# Patient Record
Sex: Male | Born: 1950 | Race: Black or African American | Hispanic: No | Marital: Married | Smoking: Never smoker
Health system: Southern US, Community
[De-identification: ages and names within clinical notes are randomized; demographics above are authoritative.]

## PROBLEM LIST (undated history)

## (undated) DIAGNOSIS — R972 Elevated prostate specific antigen [PSA]: Secondary | ICD-10-CM

## (undated) DIAGNOSIS — E119 Type 2 diabetes mellitus without complications: Secondary | ICD-10-CM

## (undated) DIAGNOSIS — N400 Enlarged prostate without lower urinary tract symptoms: Secondary | ICD-10-CM

## (undated) DIAGNOSIS — I10 Essential (primary) hypertension: Secondary | ICD-10-CM

## (undated) HISTORY — PX: NO PAST SURGERIES: SHX2092

---

## 2004-11-08 ENCOUNTER — Inpatient Hospital Stay: Payer: Self-pay | Admitting: Internal Medicine

## 2005-04-22 ENCOUNTER — Emergency Department: Payer: Self-pay | Admitting: Emergency Medicine

## 2006-05-18 IMAGING — CR DG CHEST 1V PORT
1 series · 1 of 1 positions shown · non-contrast
Comparison: none

REASON FOR EXAM: Weakness
COMMENTS:

[view not recorded]
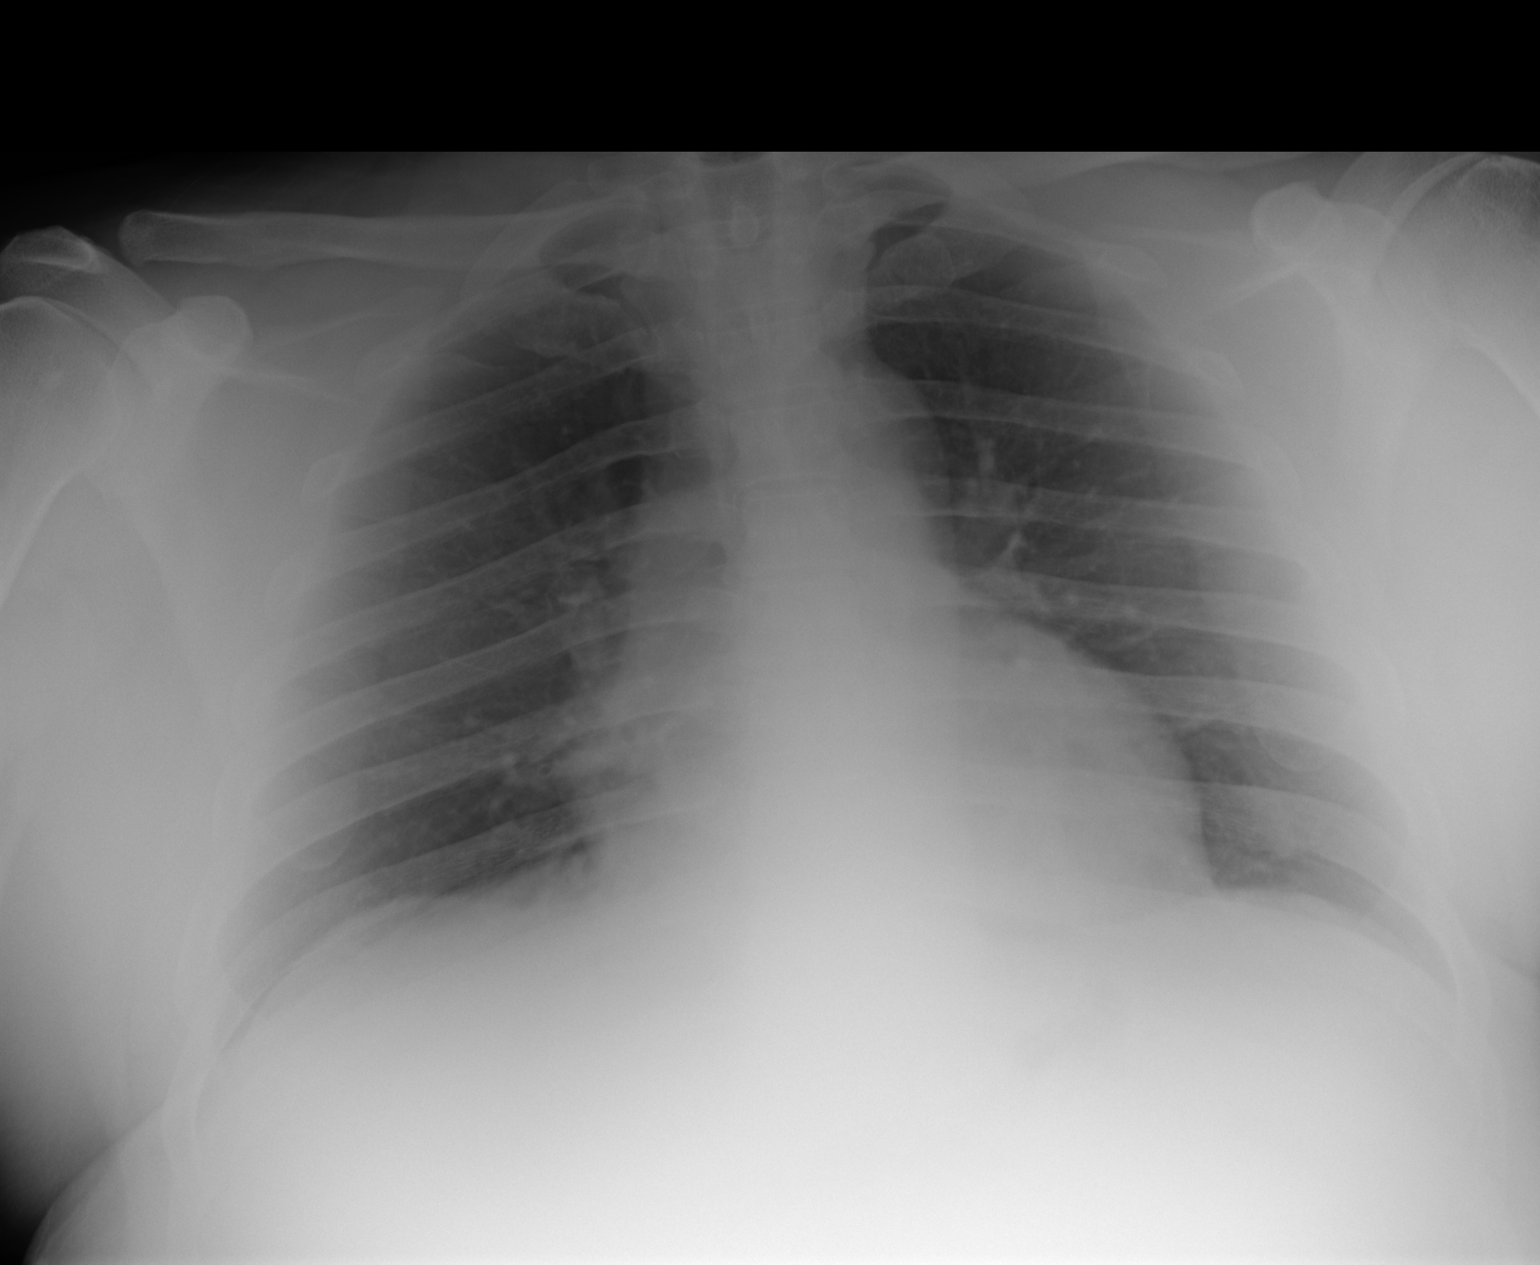

[1 of 1 positions shown; findings below may reference images not displayed]

PROCEDURE:     DXR - DXR PORTABLE CHEST SINGLE VIEW  - November 08, 2004  [DATE]

RESULT:     Comparison is made to the prior study of 04-05-02.  The lungs
appear to be clear.  The heart and pulmonary vessels are normal.  The bony
and mediastinal structures are unremarkable.  There is no interval change.
IMPRESSION: No acute cardiopulmonary disease.

Stable appearance.

## 2006-10-30 IMAGING — CR DG CHEST 2V
1 series · 2 of 2 positions shown · non-contrast
Comparison: none

REASON FOR EXAM: abd pain
COMMENTS:

PROCEDURE:     DXR - DXR CHEST PA (OR AP) AND LATERAL  - April 22, 2005 [DATE]
RESULT:     The current exam is compared to a prior exam of 11-08-04.  The
lung fields are clear. The heart, mediastinal and osseous structures show no
significant abnormalities.

[Series 1: view not recorded · 0.17mm/px · 2 of 2 slices shown]
[im 1/2]
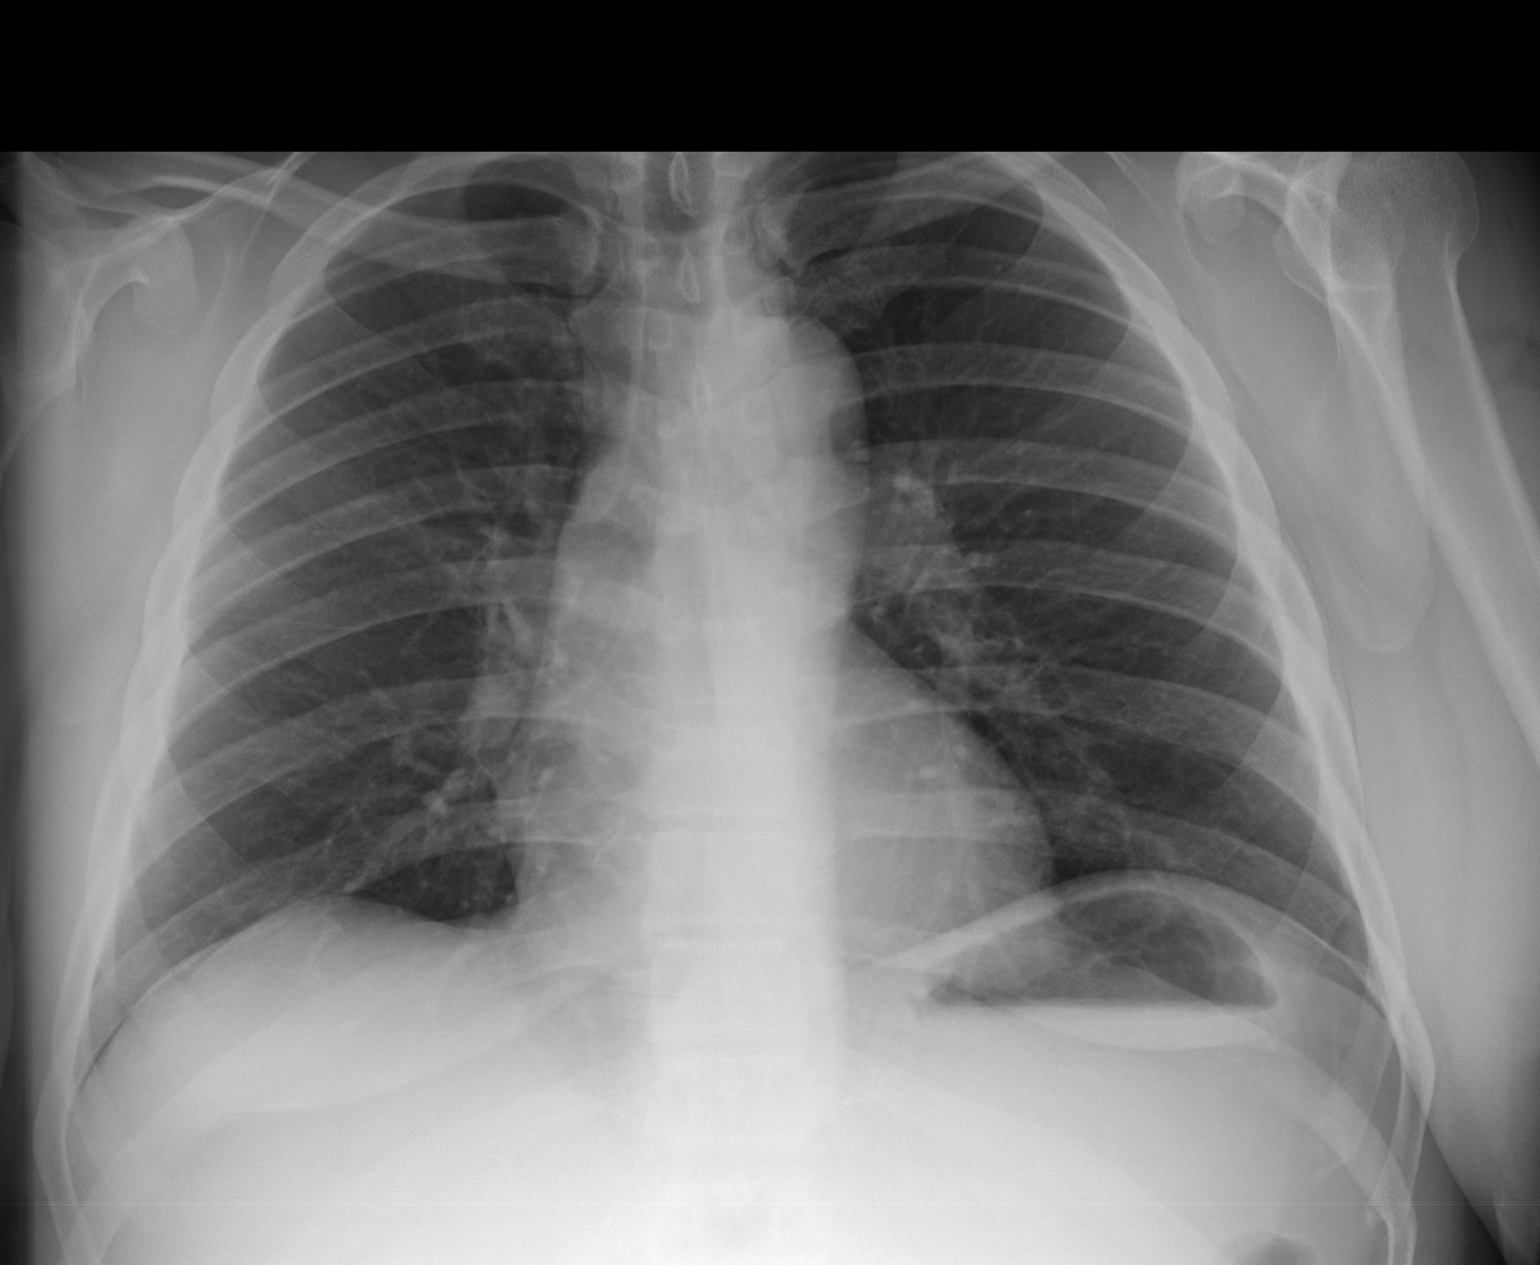
[im 2/2]
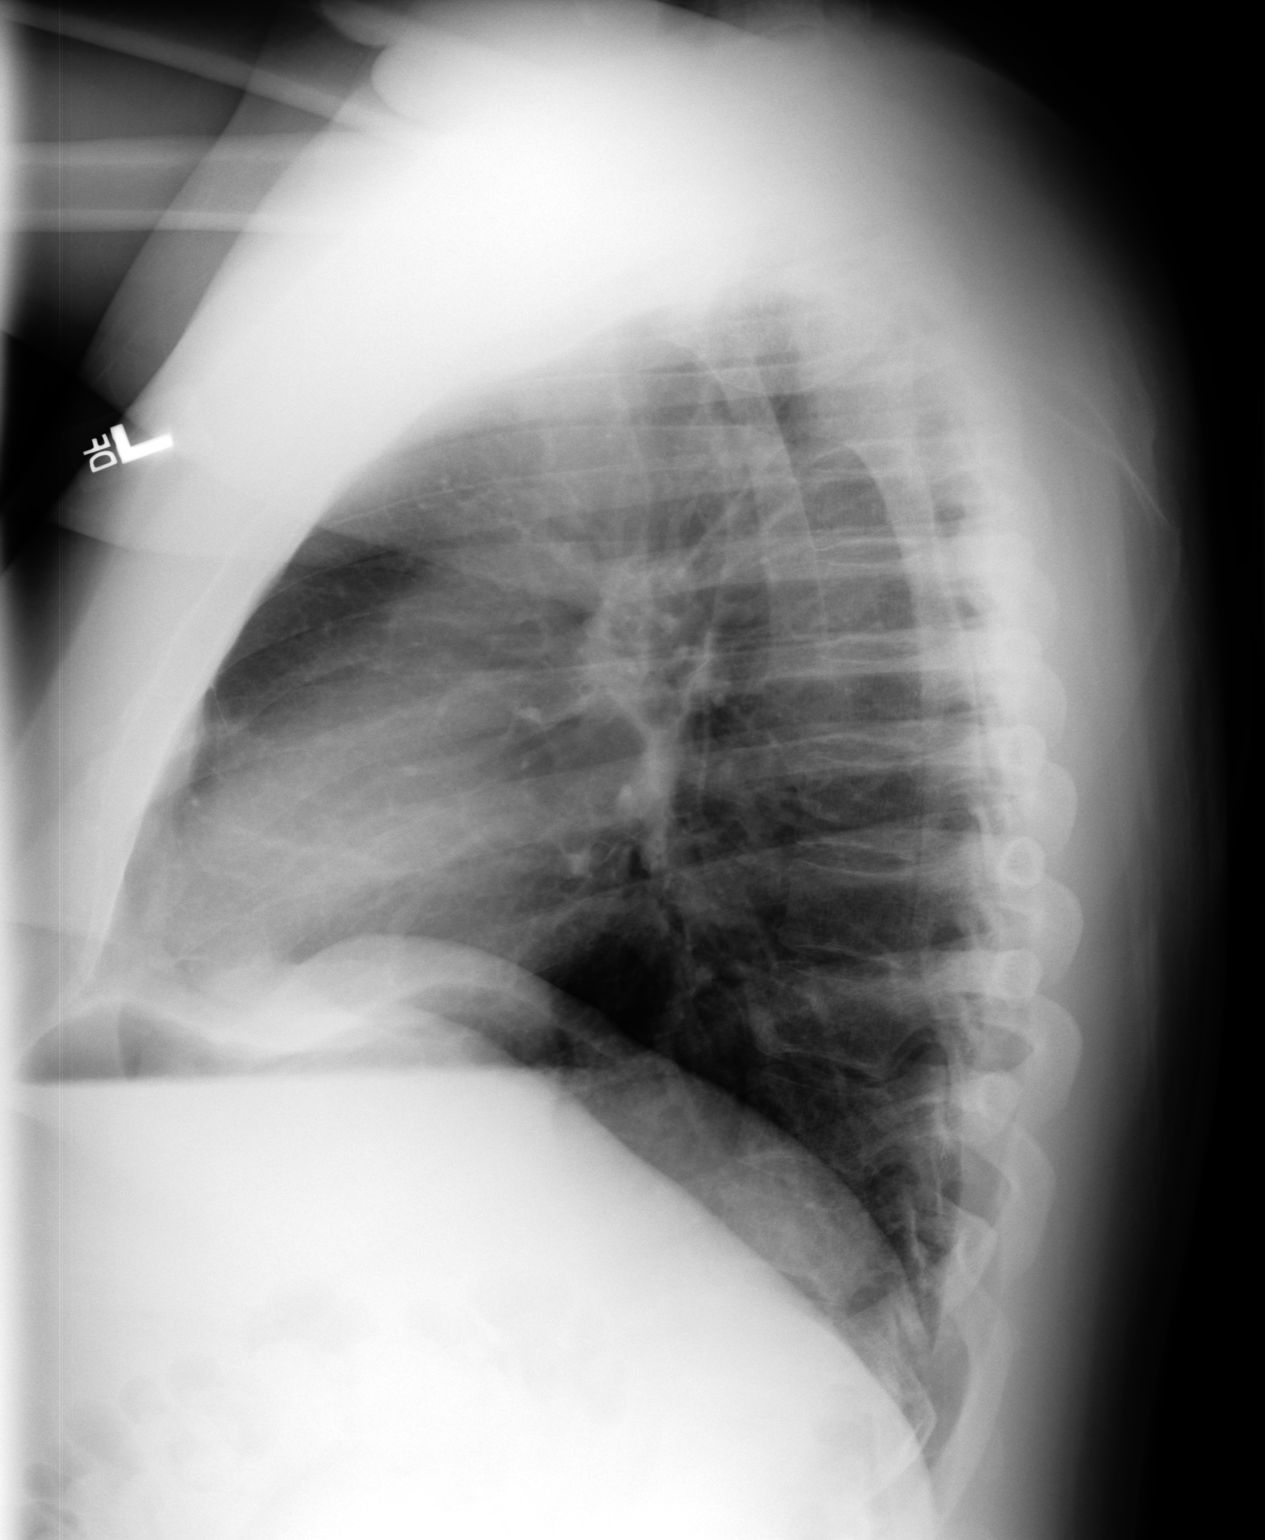

[2 of 2 positions shown; findings below may reference images not displayed]

IMPRESSION: 1)No significant abnormalities are noted.

## 2009-08-14 ENCOUNTER — Inpatient Hospital Stay: Payer: Self-pay | Admitting: Internal Medicine

## 2017-01-04 DIAGNOSIS — K922 Gastrointestinal hemorrhage, unspecified: Secondary | ICD-10-CM | POA: Insufficient documentation

## 2019-10-17 DIAGNOSIS — R300 Dysuria: Secondary | ICD-10-CM | POA: Diagnosis not present

## 2022-03-29 ENCOUNTER — Emergency Department: Payer: PPO

## 2022-03-29 ENCOUNTER — Encounter: Payer: Self-pay | Admitting: Medical Oncology

## 2022-03-29 ENCOUNTER — Other Ambulatory Visit: Payer: Self-pay

## 2022-03-29 ENCOUNTER — Emergency Department
Admission: EM | Admit: 2022-03-29 | Discharge: 2022-03-29 | Disposition: A | Discharge status: Home / Self Care | Payer: PPO | Attending: Emergency Medicine | Admitting: Emergency Medicine

## 2022-03-29 DIAGNOSIS — R109 Unspecified abdominal pain: Secondary | ICD-10-CM | POA: Insufficient documentation

## 2022-03-29 LAB — URINALYSIS, ROUTINE W REFLEX MICROSCOPIC
Bilirubin Urine: NEGATIVE
Glucose, UA: NEGATIVE mg/dL
Hgb urine dipstick: NEGATIVE
Ketones, ur: NEGATIVE mg/dL
Leukocytes,Ua: NEGATIVE
Nitrite: NEGATIVE
Protein, ur: NEGATIVE mg/dL
Specific Gravity, Urine: 1.014 (ref 1.005–1.030)
pH: 5 (ref 5.0–8.0)

## 2022-03-29 LAB — CBC
HCT: 43.9 % (ref 39.0–52.0)
Hemoglobin: 14.9 g/dL (ref 13.0–17.0)
MCH: 31 pg (ref 26.0–34.0)
MCHC: 33.9 g/dL (ref 30.0–36.0)
MCV: 91.5 fL (ref 80.0–100.0)
Platelets: 215 10*3/uL (ref 150–400)
RBC: 4.8 MIL/uL (ref 4.22–5.81)
RDW: 12.2 % (ref 11.5–15.5)
WBC: 4.4 10*3/uL (ref 4.0–10.5)
nRBC: 0 % (ref 0.0–0.2)

## 2022-03-29 LAB — BASIC METABOLIC PANEL
Anion gap: 6 (ref 5–15)
BUN: 13 mg/dL (ref 8–23)
CO2: 27 mmol/L (ref 22–32)
Calcium: 9.9 mg/dL (ref 8.9–10.3)
Chloride: 103 mmol/L (ref 98–111)
Creatinine, Ser: 0.83 mg/dL (ref 0.61–1.24)
GFR, Estimated: >60 mL/min (ref >60)
Glucose, Bld: 251 mg/dL — ABNORMAL HIGH (ref 70–99)
Potassium: 4.2 mmol/L (ref 3.5–5.1)
Sodium: 136 mmol/L (ref 135–145)

## 2022-03-29 MED ORDER — TAMSULOSIN HCL 0.4 MG PO CAPS: 0.4000 mg | ORAL_CAPSULE | Freq: Every day | ORAL | 1 refills | Status: DC

## 2022-03-29 NOTE — ED Provider Notes (Signed)
   [facility] Provider Note    Event Date/Time   First MD Initiated Contact with Patient 03/29/22 0840     (approximate)  History   Chief Complaint: Flank Pain  HPI  Nicolas Gonzales is a 71 y.o. male with no significant past medical history who presents to the emergency department for left flank pain.  According to the patient for the past month or so he has been experiencing intermittent pain in the left flank.  States he is also notes that he has been urinating more frequently than normal although states this has been going on for quite some time.  No vomiting no diarrhea no dysuria, no fever.  Physical Exam   Triage Vital Signs: ED Triage Vitals  Enc Vitals Group     BP 03/29/22 0818 (!) 147/99     Pulse Rate 03/29/22 0818 99     Resp 03/29/22 0818 17     Temp 03/29/22 0818 97.8 F (36.6 C)     Temp Source 03/29/22 0818 Oral     SpO2 03/29/22 0818 99 %     Weight 03/29/22 0819 150 lb (68 kg)     Height 03/29/22 0819 5\' 8"  (1.727 m)     Head Circumference --      Peak Flow --      Pain Score 03/29/22 0818 7     Pain Loc --      Pain Edu? --      Excl. in GC? --     Most recent vital signs: Vitals:   03/29/22 0818  BP: (!) 147/99  Pulse: 99  Resp: 17  Temp: 97.8 F (36.6 C)  SpO2: 99%    General: Awake, no distress.  CV:  Good peripheral perfusion.  Regular rate and rhythm  Resp:  Normal effort.  Equal breath sounds bilaterally.  Abd:  No distention.  Soft, nontender.  No rebound or guarding.  Benign abdomen.    ED Results / Procedures / Treatments    RADIOLOGY  I reviewed and interpreted the CT images I do not see any obvious kidney stone on my evaluation. Radiology is read the CT scan is negative for acute abnormality   MEDICATIONS ORDERED IN ED: Medications - No data to display   IMPRESSION / MDM / ASSESSMENT AND PLAN / ED COURSE  I reviewed the triage vital signs and the nursing notes.  Patient's presentation is most  consistent with acute presentation with potential threat to life or bodily function.  Patient presents emergency department for left flank pain intermittent x1 month.  Denies any nausea vomiting diarrhea or dysuria but does state increased urinary frequency.  Benign abdomen on my examination.  We will check labs and obtain a CT renal scan to further evaluate.  Differential would include musculoskeletal pain, UTI pyelonephritis, diverticulitis or colitis.  Lab work is reassuring.  CBC is normal, chemistry is normal.  Urinalysis is normal no sign of infection.  CT renal scan is normal as well.  I did discuss the patient's enlarged prostate have the patient follow-up with his primary care doctor.  Patient agreeable to plan of care.  FINAL CLINICAL IMPRESSION(S) / ED DIAGNOSES   Left flank pain   Note:  This document was prepared using Dragon voice recognition software and may include unintentional dictation errors.   03/31/22, MD 03/29/22 [phone]

## 2022-03-29 NOTE — ED Triage Notes (Signed)
Pt reports for about a month he has been having left sided flank pain that radiates around to LLQ abd. Pt reports frequent urination with slow flow but no pain with urination. Denies fever.

## 2022-03-29 NOTE — Discharge Instructions (Addendum)
As we discussed your work-up was overall reassuring with normal results however I did show an enlarged prostate.  Please follow-up with your primary care doctor regarding this for further evaluation or monitoring.

## 2022-07-26 DIAGNOSIS — N400 Enlarged prostate without lower urinary tract symptoms: Secondary | ICD-10-CM | POA: Insufficient documentation

## 2022-07-26 DIAGNOSIS — E119 Type 2 diabetes mellitus without complications: Secondary | ICD-10-CM | POA: Insufficient documentation

## 2022-07-26 DIAGNOSIS — I10 Essential (primary) hypertension: Secondary | ICD-10-CM | POA: Insufficient documentation

## 2022-08-24 ENCOUNTER — Ambulatory Visit (INDEPENDENT_AMBULATORY_CARE_PROVIDER_SITE_OTHER): Payer: PPO | Admitting: Urology

## 2022-08-24 ENCOUNTER — Encounter: Payer: Self-pay | Admitting: Urology

## 2022-08-24 VITALS — BP 165/93 | HR 94 | Ht 70.0 in | Wt 150.0 lb

## 2022-08-24 DIAGNOSIS — R972 Elevated prostate specific antigen [PSA]: Secondary | ICD-10-CM

## 2022-08-24 LAB — URINALYSIS, COMPLETE
Bilirubin, UA: NEGATIVE
Glucose, UA: NEGATIVE
Ketones, UA: NEGATIVE
Leukocytes,UA: NEGATIVE
Nitrite, UA: NEGATIVE
RBC, UA: NEGATIVE
Specific Gravity, UA: >1.03 — ABNORMAL HIGH (ref 1.005–1.030)
Urobilinogen, Ur: 2 mg/dL — ABNORMAL HIGH (ref 0.2–1.0)
pH, UA: 5 (ref 5.0–7.5)

## 2022-08-24 LAB — MICROSCOPIC EXAMINATION

## 2022-08-24 NOTE — Progress Notes (Unsigned)
   08/24/2022 1:57 PM   Parke Poisson June 03, 1951 [id number]  Referring provider: Baxter Hire, MD [place],  [place] [address]  Chief Complaint  Patient presents with   Elevated PSA    HPI: Nicolas Gonzales is a 72 y.o. male referred for evaluation of an elevated PSA.  PSA 07/26/2022 elevated 44.75 UA normal at the time of this blood draw No prior PSA results available for comparison His brother was diagnosed with prostate cancer in his early 56s Was having bothersome lower urinary tract symptoms however noted significant improvement on tamsulosin   PMH: Hypertension Diabetes BPH  Surgical History: No past surgical history on file.  Home Medications:  Allergies as of 08/24/2022   No Known Allergies      Medication List        Accurate as of August 24, 2022  1:57 PM. If you have any questions, ask your nurse or doctor.          cyanocobalamin 500 MCG tablet Commonly known as: VITAMIN B12 Take by mouth.   losartan 25 MG tablet Commonly known as: COZAAR Take 1 tablet by mouth daily.   metFORMIN 500 MG tablet Commonly known as: GLUCOPHAGE Take 500 mg by mouth every morning.   tamsulosin 0.4 MG Caps capsule Commonly known as: FLOMAX Take 1 capsule (0.4 mg total) by mouth daily.   Vitamin D-1000 Max St 25 MCG (1000 UT) tablet Generic drug: Cholecalciferol Take by mouth.        Allergies: No Known Allergies  Family History: No family history on file.  Social History:  reports that he has been smoking cigarettes. He has never used smokeless tobacco. He reports that he does not drink alcohol and does not use drugs.   Physical Exam: BP (!) 165/93   Pulse 94   Ht 5\' 10"  (1.778 m)   Wt 150 lb (68 kg)   BMI 21.52 kg/m   Constitutional:  Alert and oriented, No acute distress. HEENT: [place] AT Respiratory: Normal respiratory effort, no increased work of breathing. GU: Prostate 50 g with apical region abnormally firm  bilaterally Skin: No rashes, bruises or suspicious lesions. Psychiatric: Normal mood and affect.   Assessment & Plan:    1. Elevated PSA Discussed his elevated PSA and abnormal prostate exam are suspicious for prostate cancer Recommend scheduling TRUS/biopsy of the prostate.  The procedure was discussed in detail including potential risks of bleeding and sepsis He has requested to have this done under sedation and will schedule in same-day surgery All questions were answered and he desires to proceed ***Benard Halsted, MD  [facility] [address], [place], [place] [address] [phone]

## 2022-08-25 ENCOUNTER — Encounter: Payer: Self-pay | Admitting: Urology

## 2022-08-25 ENCOUNTER — Other Ambulatory Visit: Payer: Self-pay | Admitting: Urology

## 2022-08-25 ENCOUNTER — Telehealth: Payer: Self-pay

## 2022-08-25 DIAGNOSIS — R3989 Other symptoms and signs involving the genitourinary system: Secondary | ICD-10-CM

## 2022-08-25 DIAGNOSIS — R972 Elevated prostate specific antigen [PSA]: Secondary | ICD-10-CM

## 2022-08-25 NOTE — Progress Notes (Signed)
Surgical Physician [facility]  * Scheduling expectation : Next Available  *Length of Case: 30 minutes  *Clearance needed: no  *Anticoagulation Instructions: N/A  *Aspirin Instructions: N/A  *Post-op visit Date/Instructions:  1-2 week with pathology review  *Diagnosis: Elevated PSA; abnormal prostate exam  *Procedure:  Prostate Biopsy ([id number]) TRUS ([id number]) [place] RCVEL([id number])    Additional orders: N/A  -Admit type: OUTpatient  -Anesthesia: MAC  -VTE Prophylaxis Standing Order SCD's       Other:   -Standing Lab Orders Per Anesthesia    Lab other: UA&Urine Culture  -Standing Test orders EKG/Chest x-ray per Anesthesia       Test other:   - Medications:  Ceftriaxone(Rocephin) 1gm IV  -Other orders:  Fleets enema AM

## 2022-08-25 NOTE — Telephone Encounter (Signed)
Tried to call patient to set up Prostate Biopsy in OR, unable to leave message, no answer. Will try again.

## 2022-08-26 NOTE — Telephone Encounter (Signed)
Spoke with pt. Pt. States that he is currently doing business right now and will call me back.

## 2022-08-29 NOTE — Progress Notes (Signed)
See phone note as well. Patient not ready to schedule. Prefers to wait until late February to discuss.

## 2022-08-29 NOTE — Telephone Encounter (Signed)
Spoke with patient. Patient would like some time to think about this procedure and discuss with his family. Told me he would like to be called again after February 20th. I will remove from scheduler inbasket and will place a reminder to call him at this time to see if he is ready to schedule.

## 2022-09-19 ENCOUNTER — Telehealth: Payer: Self-pay | Admitting: *Deleted

## 2022-09-19 NOTE — Telephone Encounter (Signed)
Patient called in today and states he would like to be scheduled for Prostate Biopsy in OR,  you can reach him at [phone] .

## 2022-09-20 ENCOUNTER — Other Ambulatory Visit: Payer: Self-pay | Admitting: Urology

## 2022-09-20 ENCOUNTER — Telehealth: Payer: Self-pay

## 2022-09-20 DIAGNOSIS — R972 Elevated prostate specific antigen [PSA]: Secondary | ICD-10-CM

## 2022-09-20 NOTE — Telephone Encounter (Signed)
I spoke with Nicolas Gonzales. We have discussed possible surgery dates and Tuesday March 12th, 2024 was agreed upon by all parties. Patient given information about surgery date, what to expect pre-operatively and post operatively.  We discussed that a Pre-Admission Testing office will be calling to set up the pre-op visit that will take place prior to surgery, and that these appointments are typically done over the phone with a Pre-Admissions RN. Informed patient that our office will communicate any additional care to be provided after surgery. Patients questions or concerns were discussed during our call. Advised to call our office should there be any additional information, questions or concerns that arise. Patient verbalized understanding.

## 2022-09-20 NOTE — Telephone Encounter (Signed)
Spoke with pt. Pt. Added to OR schedule for 03/12.

## 2022-09-20 NOTE — Progress Notes (Signed)
   [facility]  Surgery Date: Date: 10/11/2022  Surgeon: Dr. John Giovanni, MD  Inpt ( No  )   Outpt (Yes)   Obs ( No  )   Diagnosis: R97.20 Elevated Prostate Specific Antigen, R39.89 Abnormal Prostate Exam  -CPT: [id number], [id number], [id number], [id number]  Surgery: Transrectal Ultrasound Guided Prostate Biopsy   Stop Anticoagulations: N/A  Cardiac/Medical/Pulmonary Clearance needed: no  *Orders entered into EPIC  Date: 09/20/22   *Case booked in [place]  Date: 09/19/2022  *Notified pt of Surgery: Date: 09/19/2022  PRE-OP UA & CX: Yes, will obtain in clinic on 09/29/2022  *Placed into Prior Authorization Work Fabio Bering Date: 09/20/22  Assistant/laser/rep:No

## 2022-09-20 NOTE — Addendum Note (Signed)
Addended by: Gerald Leitz A on: 09/20/2022 11:27 AM   Modules accepted: Orders

## 2022-09-29 ENCOUNTER — Other Ambulatory Visit: Payer: PPO

## 2022-09-29 DIAGNOSIS — R3989 Other symptoms and signs involving the genitourinary system: Secondary | ICD-10-CM

## 2022-09-29 DIAGNOSIS — R972 Elevated prostate specific antigen [PSA]: Secondary | ICD-10-CM

## 2022-09-29 LAB — URINALYSIS, COMPLETE
Bilirubin, UA: NEGATIVE
Glucose, UA: NEGATIVE
Ketones, UA: NEGATIVE
Leukocytes,UA: NEGATIVE
Nitrite, UA: NEGATIVE
Protein,UA: NEGATIVE
RBC, UA: NEGATIVE
Specific Gravity, UA: 1.025 (ref 1.005–1.030)
Urobilinogen, Ur: 1 mg/dL (ref 0.2–1.0)
pH, UA: 5 (ref 5.0–7.5)

## 2022-09-29 LAB — MICROSCOPIC EXAMINATION

## 2022-10-01 ENCOUNTER — Encounter: Payer: Self-pay | Admitting: Urgent Care

## 2022-10-03 ENCOUNTER — Encounter
Admission: RE | Admit: 2022-10-03 | Discharge: 2022-10-03 | Disposition: A | Discharge status: Home / Self Care | Payer: PPO | Source: Ambulatory Visit | Attending: Urology | Admitting: Urology

## 2022-10-03 DIAGNOSIS — Z01810 Encounter for preprocedural cardiovascular examination: Secondary | ICD-10-CM

## 2022-10-03 DIAGNOSIS — I10 Essential (primary) hypertension: Secondary | ICD-10-CM

## 2022-10-03 DIAGNOSIS — Z01818 Encounter for other preprocedural examination: Secondary | ICD-10-CM

## 2022-10-03 DIAGNOSIS — E119 Type 2 diabetes mellitus without complications: Secondary | ICD-10-CM

## 2022-10-03 HISTORY — DX: Type 2 diabetes mellitus without complications: E11.9

## 2022-10-03 HISTORY — DX: Elevated prostate specific antigen (PSA): R97.20

## 2022-10-03 HISTORY — DX: Essential (primary) hypertension: I10

## 2022-10-03 HISTORY — DX: Benign prostatic hyperplasia without lower urinary tract symptoms: N40.0

## 2022-10-03 NOTE — Patient Instructions (Addendum)
Your procedure is scheduled on:10-11-22 Tuesday Report to the Registration Desk on the 1st floor of the [facility].Then proceed to the 2nd floor Surgery Desk To find out your arrival time, please call [phone] between 1PM - 3PM on:10-10-22 Monday If your arrival time is 6:00 am, do not arrive before that time as the [facility] entrance doors do not open until 6:00 am.  REMEMBER: Instructions that are not followed completely may result in serious medical risk, up to and including death; or upon the discretion of your surgeon and anesthesiologist your surgery may need to be rescheduled.  Do not eat food OR drink any liquids after midnight the night before surgery.  No gum chewing or hard candies.  One week prior to surgery: Stop Anti-inflammatories (NSAIDS) such as Advil, Aleve, Ibuprofen, Motrin, Naproxen, Naprosyn and Aspirin based products such as Excedrin, Goody's Powder, BC Powder.You may however, take Tylenol if needed for pain up until the day of surgery.  Stop ANY OVER THE COUNTER supplements/vitamins NOW (10-03-22) until after surgery (Vitamin D, Cinnamon, Cod Liver Oil, Vitamin B12, Iron, Fiber and Zinc)  Stop your metFORMIN (GLUCOPHAGE) 2 days prior to surgery-Last dose will be on 10-08-22 Saturday  Do NOT take any medication the day of surgery  Fleets enema as directed-Do Fleet Enema at home the morning of surgery 2 hours prior to arrival time to the hospital (Enema is enclosed in your surgery bag)  No Alcohol for 24 hours before or after surgery.  No Smoking including e-cigarettes for 24 hours before surgery.  No chewable tobacco products for at least 6 hours before surgery.  No nicotine patches on the day of surgery.  Do not use any "recreational" drugs for at least a week (preferably 2 weeks) before your surgery.  Please be advised that the combination of cocaine and anesthesia may have negative outcomes, up to and including death. If you test positive for cocaine, your  surgery will be cancelled.  On the morning of surgery brush your teeth with toothpaste and water, you may rinse your mouth with mouthwash if you wish. Do not swallow any toothpaste or mouthwash.  Do not wear jewelry, make-up, hairpins, clips or nail polish.  Do not wear lotions, powders, or perfumes.   Do not shave body hair from the neck down 48 hours before surgery.  Contact lenses, hearing aids and dentures may not be worn into surgery.  Do not bring valuables to the hospital. [facility] is not responsible for any missing/lost belongings or valuables.   Notify your doctor if there is any change in your medical condition (cold, fever, infection).  Wear comfortable clothing (specific to your surgery type) to the hospital.  After surgery, you can help prevent lung complications by doing breathing exercises.  Take deep breaths and cough every 1-2 hours. Your doctor may order a device called an Incentive Spirometer to help you take deep breaths. When coughing or sneezing, hold a pillow firmly against your incision with both hands. This is called "splinting." Doing this helps protect your incision. It also decreases belly discomfort.  If you are being admitted to the hospital overnight, leave your suitcase in the car. After surgery it may be brought to your room.  In case of increased patient census, it may be necessary for you, the patient, to continue your postoperative care in the Same Day Surgery department.  If you are being discharged the day of surgery, you will not be allowed to drive home. You will need  a responsible individual to drive you home and stay with you for 24 hours after surgery.   If you are taking public transportation, you will need to have a responsible individual with you.  Please call the [facility] Dept. at [phone] if you have any questions about these instructions.  Surgery Visitation Policy:  Patients undergoing a surgery or procedure  may have two family members or support persons with them as long as the person is not COVID-19 positive or experiencing its symptoms.   Due to an increase in RSV and influenza rates and associated hospitalizations, children ages 95 and under will not be able to visit patients in [facility]. Masks continue to be strongly recommended.  Sodium Phosphate Monobasic; Sodium Phosphate Dibasic Enema (Fleet Enema) What is this medication? SODIUM PHOSPATE SALTS (SOE dee um FOS fate sawlts) treats occasional constipation. It may also be used to clean the bowel out before a medical procedure. It works by increasing the amount of water your intestine absorbs. This softens the stool, making it easier to have a bowel movement. It also increases pressure, which prompts the muscles in your intestines to move stool. It belongs to a group of medications called laxatives. This medicine may be used for other purposes; ask your health care provider or pharmacist if you have questions. COMMON BRAND NAME(S): Fleet, Fleet Pedia-Lax, Ready To Use Saline  How should I use this medication? This medication is for rectal use only. Do not take it by mouth. Take it as directed on the prescription label. Do not use more often than directed. Wash your hands before and after use. Remove top of enema. Lubricate the tip of the bottle. Lie on your side with your lower leg straightened out and your upper leg bent forward toward your stomach. Lift upper buttock to expose the rectal area. Gently insert the tip into the rectum. Squeeze bottle until it is empty. Wait a few seconds before removing the bottle. Hold buttocks together for a few seconds. Remain lying down for about 15 minutes to avoid having the medication come out. Talk to your care team about the use of this medication in children.

## 2022-10-04 LAB — CULTURE, URINE COMPREHENSIVE

## 2022-10-05 ENCOUNTER — Encounter
Admission: RE | Admit: 2022-10-05 | Discharge: 2022-10-05 | Disposition: A | Discharge status: Home / Self Care | Payer: PPO | Source: Ambulatory Visit | Attending: Urology | Admitting: Urology

## 2022-10-05 DIAGNOSIS — I10 Essential (primary) hypertension: Secondary | ICD-10-CM

## 2022-10-05 DIAGNOSIS — Z01810 Encounter for preprocedural cardiovascular examination: Secondary | ICD-10-CM

## 2022-10-05 DIAGNOSIS — R9431 Abnormal electrocardiogram [ECG] [EKG]: Secondary | ICD-10-CM | POA: Insufficient documentation

## 2022-10-05 DIAGNOSIS — E119 Type 2 diabetes mellitus without complications: Secondary | ICD-10-CM | POA: Diagnosis not present

## 2022-10-10 MED ORDER — SODIUM CHLORIDE 0.9 % IV SOLN
1.0000 g | INTRAVENOUS | Status: AC
  Administered 2022-10-11: 1 g via INTRAVENOUS
  Filled 2022-10-10: qty 1

## 2022-10-10 MED ORDER — FLEET ENEMA 7-19 GM/118ML RE ENEM: 1.0000 | ENEMA | Freq: Once | RECTAL | Status: DC

## 2022-10-10 MED ORDER — SODIUM CHLORIDE 0.9 % IV SOLN: INTRAVENOUS | Status: DC

## 2022-10-10 MED ORDER — ORAL CARE MOUTH RINSE: 15.0000 mL | Freq: Once | OROMUCOSAL | Status: AC

## 2022-10-10 MED ORDER — CHLORHEXIDINE GLUCONATE 0.12 % MT SOLN: 15.0000 mL | Freq: Once | OROMUCOSAL | Status: AC

## 2022-10-10 MED ORDER — FAMOTIDINE 20 MG PO TABS: 20.0000 mg | ORAL_TABLET | Freq: Once | ORAL | Status: AC

## 2022-10-11 ENCOUNTER — Encounter: Payer: Self-pay | Admitting: Urology

## 2022-10-11 ENCOUNTER — Ambulatory Visit: Payer: PPO | Admitting: Certified Registered"

## 2022-10-11 ENCOUNTER — Encounter
Admission: RE | Disposition: A | Discharge status: Home / Self Care | Payer: Self-pay | Source: Ambulatory Visit | Attending: Urology

## 2022-10-11 ENCOUNTER — Ambulatory Visit
Admission: RE | Admit: 2022-10-11 | Discharge: 2022-10-11 | Disposition: A | Discharge status: Home / Self Care | Payer: PPO | Source: Ambulatory Visit | Attending: Urology | Admitting: Urology

## 2022-10-11 ENCOUNTER — Ambulatory Visit: Payer: PPO | Admitting: Urgent Care

## 2022-10-11 ENCOUNTER — Other Ambulatory Visit: Payer: Self-pay

## 2022-10-11 DIAGNOSIS — Z01818 Encounter for other preprocedural examination: Secondary | ICD-10-CM

## 2022-10-11 DIAGNOSIS — R3989 Other symptoms and signs involving the genitourinary system: Secondary | ICD-10-CM

## 2022-10-11 DIAGNOSIS — N400 Enlarged prostate without lower urinary tract symptoms: Secondary | ICD-10-CM | POA: Diagnosis present

## 2022-10-11 DIAGNOSIS — R972 Elevated prostate specific antigen [PSA]: Secondary | ICD-10-CM | POA: Insufficient documentation

## 2022-10-11 DIAGNOSIS — C61 Malignant neoplasm of prostate: Secondary | ICD-10-CM | POA: Diagnosis not present

## 2022-10-11 HISTORY — PX: TRANSRECTAL ULTRASOUND: SHX5146

## 2022-10-11 HISTORY — PX: PROSTATE BIOPSY: SHX241

## 2022-10-11 LAB — GLUCOSE, CAPILLARY
Glucose-Capillary: 116 mg/dL — ABNORMAL HIGH (ref 70–99)
Glucose-Capillary: 126 mg/dL — ABNORMAL HIGH (ref 70–99)

## 2022-10-11 SURGERY — BIOPSY, PROSTATE
Anesthesia: Monitor Anesthesia Care | Site: Prostate

## 2022-10-11 MED ORDER — FENTANYL CITRATE (PF) 100 MCG/2ML IJ SOLN
INTRAMUSCULAR | Status: AC
  Filled 2022-10-11: qty 2

## 2022-10-11 MED ORDER — ONDANSETRON HCL 4 MG/2ML IJ SOLN: 4.0000 mg | Freq: Once | INTRAMUSCULAR | Status: DC | PRN

## 2022-10-11 MED ORDER — SULFAMETHOXAZOLE-TRIMETHOPRIM 800-160 MG PO TABS: 1.0000 | ORAL_TABLET | Freq: Two times a day (BID) | ORAL | 0 refills | Status: AC

## 2022-10-11 MED ORDER — FENTANYL CITRATE (PF) 100 MCG/2ML IJ SOLN: 25.0000 ug | INTRAMUSCULAR | Status: DC | PRN

## 2022-10-11 MED ORDER — FENTANYL CITRATE (PF) 100 MCG/2ML IJ SOLN
INTRAMUSCULAR | Status: DC | PRN
  Administered 2022-10-11 (×2): 25 ug via INTRAVENOUS

## 2022-10-11 MED ORDER — CHLORHEXIDINE GLUCONATE 0.12 % MT SOLN
OROMUCOSAL | Status: AC
  Administered 2022-10-11: 15 mL via OROMUCOSAL
  Filled 2022-10-11: qty 15

## 2022-10-11 MED ORDER — MIDAZOLAM HCL 2 MG/2ML IJ SOLN
INTRAMUSCULAR | Status: DC | PRN
  Administered 2022-10-11: 2 mg via INTRAVENOUS

## 2022-10-11 MED ORDER — PROPOFOL 1000 MG/100ML IV EMUL
INTRAVENOUS | Status: AC
  Filled 2022-10-11: qty 100

## 2022-10-11 MED ORDER — PROPOFOL 500 MG/50ML IV EMUL
INTRAVENOUS | Status: DC | PRN
  Administered 2022-10-11: 100 ug/kg/min via INTRAVENOUS

## 2022-10-11 MED ORDER — FAMOTIDINE 20 MG PO TABS
ORAL_TABLET | ORAL | Status: AC
  Administered 2022-10-11: 20 mg via ORAL
  Filled 2022-10-11: qty 1

## 2022-10-11 MED ORDER — MIDAZOLAM HCL 2 MG/2ML IJ SOLN
INTRAMUSCULAR | Status: AC
  Filled 2022-10-11: qty 2

## 2022-10-11 SURGICAL SUPPLY — 8 items
COVER MAYO STAND REUSABLE (DRAPES) ×1 IMPLANT
GAUZE SPONGE 4X4 12PLY STRL (GAUZE/BANDAGES/DRESSINGS) ×1 IMPLANT
GLOVE SURG UNDER POLY LF SZ7.5 (GLOVE) ×1 IMPLANT
INST BIOPSY MAXCORE 18GX25 (NEEDLE) ×1 IMPLANT
KIT TURNOVER CYSTO (KITS) ×1 IMPLANT
MANIFOLD NEPTUNE II (INSTRUMENTS) ×1 IMPLANT
SURGILUBE 2OZ TUBE FLIPTOP (MISCELLANEOUS) ×1 IMPLANT
WATER STERILE IRR 500ML POUR (IV SOLUTION) ×1 IMPLANT

## 2022-10-11 NOTE — Op Note (Signed)
   Preoperative diagnosis:  Elevated PSA Abnormal digital rectal exam  Postoperative diagnosis:  Same  Procedure: Transrectal ultrasound prostate Transrectal prostate biopsies  Surgeon: Abbie Sons, MD  Anesthesia: MAC  Complications: None  Intraoperative findings:  Prostate volume calculated 42 g Bilateral peripheral zone diffusely hypoechoic  EBL: Minimal  Specimens: Transrectal prostate biopsies  Indication: Nicolas Gonzales is a 72 y.o. patient with a PSA of 44 and abnormal DRE.  After reviewing the management options for treatment, he elected to proceed with the above surgical procedure(s). We have discussed the potential benefits and risks of the procedure, side effects of the proposed treatment, the likelihood of the patient achieving the goals of the procedure, and any potential problems that might occur during the procedure or recuperation. Informed consent has been obtained.  Description of procedure:  The patient was taken to the operating room and was not transferred to the OR table and remained on the OR gurney.  He was placed in the left lateral decubitus position with knees to chest and sedation was obtained by anesthesia. preoperative antibiotics were administered. A preoperative time-out was performed.   DRE was performed and the lower one half of the prostate was abnormally firm bilaterally.  A transrectal ultrasound probe was lubricated and gently inserted per rectum.  TRUS was performed with findings as described above.  Standard 12 core prostate biopsies were obtained under ultrasound guidance.  At the complete procedure the ultrasound probe was removed.  Minimal bleeding was noted.  He was transported to the PACU in stable condition.   Plan: Will call with biopsy results   Abbie Sons, M.D.

## 2022-10-11 NOTE — Anesthesia Preprocedure Evaluation (Signed)
Anesthesia Evaluation  Patient identified by MRN, date of birth, ID band Patient awake    Reviewed: Allergy & Precautions, H&P , NPO status , Patient's Chart, lab work & pertinent test results, reviewed documented beta blocker date and time   Airway Mallampati: II  TM Distance: >3 FB Neck ROM: full    Dental no notable dental hx. (+) Teeth Intact   Pulmonary neg pulmonary ROS   Pulmonary exam normal breath sounds clear to auscultation       Cardiovascular Exercise Tolerance: Good hypertension, On Medications negative cardio ROS  Rhythm:regular Rate:Normal     Neuro/Psych negative neurological ROS  negative psych ROS   GI/Hepatic negative GI ROS, Neg liver ROS,,,  Endo/Other  negative endocrine ROSdiabetes, Well Controlled, Type 2, Oral Hypoglycemic Agents    Renal/GU      Musculoskeletal   Abdominal   Peds  Hematology negative hematology ROS (+)   Anesthesia Other Findings   Reproductive/Obstetrics negative OB ROS                             Anesthesia Physical Anesthesia Plan  ASA: 2  Anesthesia Plan: MAC   Post-op Pain Management:    Induction:   PONV Risk Score and Plan: 2  Airway Management Planned:   Additional Equipment:   Intra-op Plan:   Post-operative Plan:   Informed Consent: I have reviewed the patients History and Physical, chart, labs and discussed the procedure including the risks, benefits and alternatives for the proposed anesthesia with the patient or authorized representative who has indicated his/her understanding and acceptance.       Plan Discussed with: CRNA  Anesthesia Plan Comments:        Anesthesia Quick Evaluation

## 2022-10-11 NOTE — H&P (Signed)
   Urology H&P  History of Present Illness: Nicolas Gonzales is a 72 y.o. with a PSA of 44.75.  Family history prostate cancer.  He requested prostate biopsy be performed under sedation.  Abnormal DRE with increased firmness apical region  Past Medical History:  Diagnosis Date   BPH (benign prostatic hyperplasia)    Diabetes mellitus without complication (HCC)    Elevated PSA    Hypertension     Past Surgical History:  Procedure Laterality Date   NO PAST SURGERIES      Home Medications:  No outpatient medications have been marked as taking for the 10/11/22 encounter [facility] Encounter).    Allergies: No Known Allergies  History reviewed. No pertinent family history.  Social History:  reports that he has never smoked. He has never used smokeless tobacco. He reports that he does not drink alcohol and does not use drugs.  ROS: A complete review of systems was performed.  All systems are negative except for pertinent findings as noted.  Physical Exam:  Vital signs in last 24 hours: Temp:  [98.4 F (36.9 C)] 98.4 F (36.9 C) (03/12 0619) Pulse Rate:  [64] 64 (03/12 0619) BP: (161)/(92) 161/92 (03/12 0619) SpO2:  [100 %] 100 % (03/12 0619) Weight:  [70.3 kg] 70.3 kg (03/12 0619) Constitutional:  Alert and oriented, No acute distress HEENT: [place] AT, moist mucus membranes.  Trachea midline, no masses Cardiovascular: Regular rate and rhythm Respiratory: Normal respiratory effort, lungs clear bilaterally Psychiatric: Normal mood and affect   Impression/Assessment:  Elevated PSA with abnormal DRE  Plan:  TRUS/biopsy prostate under sedation I again discussed the procedure in detail including potential risks of bleeding and infection/sepsis.  All questions were answered and he desires to proceed   10/11/2022, 7:13 AM  John Giovanni,  MD

## 2022-10-11 NOTE — Interval H&P Note (Signed)
History and Physical Interval Note:  10/11/2022 7:20 AM  Nicolas Gonzales  has presented today for surgery, with the diagnosis of Elevated PSA, Abnormal Prostate Exam.  The various methods of treatment have been discussed with the patient and family. After consideration of risks, benefits and other options for treatment, the patient has consented to  Procedure(s): PROSTATE BIOPSY (N/A) TRANSRECTAL ULTRASOUND (N/A) as a surgical intervention.  The patient's history has been reviewed, patient examined, no change in status, stable for surgery.  I have reviewed the patient's chart and labs.  Questions were answered to the patient's satisfaction.     [place]

## 2022-10-11 NOTE — Transfer of Care (Signed)
Immediate Anesthesia Transfer of Care Note  Patient: Nicolas Gonzales  Procedure(s) Performed: PROSTATE BIOPSY (Prostate) TRANSRECTAL ULTRASOUND (Prostate)  Patient Location: PACU  Anesthesia Type:General  Level of Consciousness: drowsy  Airway & Oxygen Therapy: Patient Spontanous Breathing and Patient connected to face mask oxygen  Post-op Assessment: Report given to RN and Post -op Vital signs reviewed and stable  Post vital signs: Reviewed and stable  Last Vitals:  Vitals Value Taken Time  BP 97/63 10/11/22 0756  Temp 36.2 C 10/11/22 0756  Pulse 71 10/11/22 0758  Resp 13 10/11/22 0758  SpO2 99 % 10/11/22 0758  Vitals shown include unvalidated device data.  Last Pain:  Vitals:   10/11/22 0619  TempSrc: Temporal  PainSc: 0-No pain         Complications: No notable events documented.

## 2022-10-11 NOTE — Discharge Instructions (Addendum)
Discharge instructions:  Blood in the urine and from the rectum is normal.  Typically blood from the rectum clears within 8 hours.  Blood in the urine may be intermittent for 2-3 weeks. Blood in the semen is common and may take several weeks to resolve. Call our office [phone] for fever greater than 101 degrees, excessive bleeding with clots or urinary problems You may resume regular activities in 24 hours You will be contacted with your pathology results and further follow-up recommendations at that time  [place]   The drugs that you were given will stay in your system until tomorrow so for the next 24 hours you should not:  Drive an automobile Make any legal decisions Drink any alcoholic beverage   You may resume regular meals tomorrow.  Today it is better to start with liquids and gradually work up to solid foods.  You may eat anything you prefer, but it is better to start with liquids, then soup and crackers, and gradually work up to solid foods.   Please notify your doctor immediately if you have any unusual bleeding, trouble breathing, redness and pain at the surgery site, drainage, fever, or pain not relieved by medication.    Additional Instructions:    Please contact your physician with any problems or Same Day Surgery at [phone], Monday through Friday 6 am to 4 pm, or [facility] number at [phone].

## 2022-10-12 ENCOUNTER — Encounter: Payer: Self-pay | Admitting: Urology

## 2022-10-12 LAB — SURGICAL PATHOLOGY

## 2022-10-14 NOTE — Anesthesia Postprocedure Evaluation (Signed)
Anesthesia Post Note  Patient: Nicolas Gonzales  Procedure(s) Performed: PROSTATE BIOPSY (Prostate) TRANSRECTAL ULTRASOUND (Prostate)  Patient location during evaluation: PACU Anesthesia Type: MAC Level of consciousness: awake and alert Pain management: pain level controlled Vital Signs Assessment: post-procedure vital signs reviewed and stable Respiratory status: spontaneous breathing, nonlabored ventilation, respiratory function stable and patient connected to nasal cannula oxygen Cardiovascular status: blood pressure returned to baseline and stable Postop Assessment: no apparent nausea or vomiting Anesthetic complications: no   No notable events documented.   Last Vitals:  Vitals:   10/11/22 0840 10/11/22 0856  BP: (!) 142/87 (!) 140/92  Pulse: 74 83  Resp: (!) 38 20  Temp: (!) 36.1 C (!) 36.1 C  SpO2: 97% 100%    Last Pain:  Vitals:   10/12/22 0841  TempSrc:   PainSc: 0-No pain                 Molli Barrows

## 2022-10-19 ENCOUNTER — Ambulatory Visit: Payer: PPO | Admitting: Urology

## 2022-10-19 ENCOUNTER — Other Ambulatory Visit: Payer: PPO

## 2022-10-19 ENCOUNTER — Encounter: Payer: Self-pay | Admitting: Urology

## 2022-10-19 VITALS — BP 124/72 | HR 97 | Ht 68.0 in | Wt 147.6 lb

## 2022-10-19 DIAGNOSIS — C61 Malignant neoplasm of prostate: Secondary | ICD-10-CM

## 2022-10-19 DIAGNOSIS — R3989 Other symptoms and signs involving the genitourinary system: Secondary | ICD-10-CM

## 2022-10-19 DIAGNOSIS — R972 Elevated prostate specific antigen [PSA]: Secondary | ICD-10-CM

## 2022-10-19 NOTE — Progress Notes (Signed)
I, DeAsia L Maxie,acting as a scribe for Abbie Sons, MD.,have documented all relevant documentation on the behalf of Abbie Sons, MD,as directed by  Abbie Sons, MD while in the presence of Abbie Sons, MD.   10/19/22 3:49 PM   Parke Poisson 1951/02/21 [id number]   Chief Complaint  Patient presents with   Other    Pathology report   Urologic History Elevated PSA -PSA 07/26/2022 elevated at 44.75 -His brother was diagnosed with prostate cancer in his early 54s  HPI: Nicolas Gonzales is a 72 y.o. male who presents for prostate biopsy follow-up.   Status post prostate biopsy on 10/11/22  Doing well post procedure PSA 07/26/2022 elevated 44.75 Remains on Tamsulosin He is accompanied by his son. Prostate volume calculated 42 g.  12 core biopsies were obtained (mid and lateral cores per specimen containers)     PMH: Hypertension Diabetes BPH  Surgical History: Past Surgical History:  Procedure Laterality Date   NO PAST SURGERIES     PROSTATE BIOPSY N/A 10/11/2022   Procedure: PROSTATE BIOPSY;  Surgeon: Abbie Sons, MD;  Location: [facility] ORS;  Service: Urology;  Laterality: N/A;   TRANSRECTAL ULTRASOUND N/A 10/11/2022   Procedure: TRANSRECTAL ULTRASOUND;  Surgeon: Abbie Sons, MD;  Location: [facility] ORS;  Service: Urology;  Laterality: N/A;    Home Medications:  Allergies as of 10/19/2022   No Known Allergies      Medication List        Accurate as of October 19, 2022  3:49 PM. If you have any questions, ask your nurse or doctor.          CINNAMON PO Take 1 tablet by mouth daily at 6 (six) AM.   COD LIVER OIL PO Take 1 tablet by mouth daily at 6 (six) AM.   cyanocobalamin 500 MCG tablet Commonly known as: VITAMIN B12 Take 1 tablet by mouth daily.   FIBER PO Take 1 tablet by mouth daily at 6 (six) AM.   IRON PO Take 1 tablet by mouth daily at 6 (six) AM.   losartan 25 MG tablet Commonly known as: COZAAR Take 1 tablet by  mouth every morning.   metFORMIN 500 MG tablet Commonly known as: GLUCOPHAGE Take 500 mg by mouth every morning.   tamsulosin 0.4 MG Caps capsule Commonly known as: FLOMAX Take 1 capsule (0.4 mg total) by mouth daily.   Vitamin D-1000 Max St 25 MCG (1000 UT) tablet Generic drug: Cholecalciferol Take 1,000 Units by mouth daily.   ZINC PO Take 1 tablet by mouth daily at 6 (six) AM.        Allergies: No Known Allergies  Social History:  reports that he has never smoked. He has never used smokeless tobacco. He reports that he does not drink alcohol and does not use drugs.   Physical Exam: BP 124/72   Pulse 97   Ht 5\' 8"  (1.727 m)   Wt 147 lb 9.6 oz (67 kg)   BMI 22.44 kg/m   Constitutional:  Alert and oriented, No acute distress.   Assessment & Plan:    1. Prostate cancer High risk prostate cancer per NCCN risk stratification Further staging with PSMA/PET If no evidence of metastatic disease, we briefly discussed options of radical prostatectomy and radiation modalities. He does not desire surgery and would elect radiation therapy. We'll call with his staging results.   [place] [address] [place], [place] [address] [phone]  [phone] 

## 2022-11-07 ENCOUNTER — Ambulatory Visit
Admission: RE | Admit: 2022-11-07 | Discharge: 2022-11-07 | Disposition: A | Discharge status: Home / Self Care | Payer: PPO | Source: Ambulatory Visit | Attending: Urology | Admitting: Urology

## 2022-11-07 DIAGNOSIS — C61 Malignant neoplasm of prostate: Secondary | ICD-10-CM | POA: Diagnosis not present

## 2022-11-07 DIAGNOSIS — R590 Localized enlarged lymph nodes: Secondary | ICD-10-CM | POA: Insufficient documentation

## 2022-11-07 DIAGNOSIS — C7951 Secondary malignant neoplasm of bone: Secondary | ICD-10-CM | POA: Diagnosis not present

## 2022-11-07 DIAGNOSIS — I700 Atherosclerosis of aorta: Secondary | ICD-10-CM | POA: Insufficient documentation

## 2022-11-07 MED ORDER — PIFLIFOLASTAT F 18 (PYLARIFY) INJECTION
9.0000 | Freq: Once | INTRAVENOUS | Status: AC
  Administered 2022-11-07: 9.4 via INTRAVENOUS

## 2022-11-11 ENCOUNTER — Telehealth: Payer: Self-pay | Admitting: *Deleted

## 2022-11-11 DIAGNOSIS — C61 Malignant neoplasm of prostate: Secondary | ICD-10-CM

## 2022-11-11 NOTE — Telephone Encounter (Signed)
Pt's son calling asking about results of PET scan. Please advise.  . Prostate cancer High risk prostate cancer per NCCN risk stratification Further staging with PSMA/PET If no evidence of metastatic disease, we briefly discussed options of radical prostatectomy and radiation modalities. He does not desire surgery and would elect radiation therapy. We'll call with his staging results.

## 2022-11-15 NOTE — Telephone Encounter (Signed)
I called patient's son twice on 11/11/2022 and this afternoon and keep getting a voicemail stating the person was not available at this time and to try again later.  No option for leaving a voicemail.  I was able to get in touch with Nicolas Gonzales today and we did discuss his PET scan results.  He was informed that the scan showed large volume osseous metastasis and nodal metastasis.  We discussed that the cancer is not considered curable however can be treated.  Have recommended oncology referral to discuss ADT and potentially an androgen receptor inhibitor.  I also asked Nicolas Gonzales to let his son know I have attempted to call and if he has any questions to have him call our office.

## 2022-11-18 ENCOUNTER — Inpatient Hospital Stay: Payer: PPO | Attending: Oncology | Admitting: Oncology

## 2022-11-18 ENCOUNTER — Inpatient Hospital Stay: Payer: PPO

## 2022-11-18 ENCOUNTER — Encounter: Payer: Self-pay | Admitting: Oncology

## 2022-11-18 VITALS — BP 135/82 | HR 86 | Temp 95.0°F | Resp 16 | Wt 144.0 lb

## 2022-11-18 DIAGNOSIS — R7401 Elevation of levels of liver transaminase levels: Secondary | ICD-10-CM

## 2022-11-18 DIAGNOSIS — Z7189 Other specified counseling: Secondary | ICD-10-CM | POA: Diagnosis not present

## 2022-11-18 DIAGNOSIS — Z79899 Other long term (current) drug therapy: Secondary | ICD-10-CM | POA: Diagnosis not present

## 2022-11-18 DIAGNOSIS — C7951 Secondary malignant neoplasm of bone: Secondary | ICD-10-CM | POA: Diagnosis not present

## 2022-11-18 DIAGNOSIS — C61 Malignant neoplasm of prostate: Secondary | ICD-10-CM | POA: Diagnosis present

## 2022-11-18 DIAGNOSIS — G893 Neoplasm related pain (acute) (chronic): Secondary | ICD-10-CM | POA: Diagnosis not present

## 2022-11-18 DIAGNOSIS — E871 Hypo-osmolality and hyponatremia: Secondary | ICD-10-CM | POA: Diagnosis not present

## 2022-11-18 LAB — CBC WITH DIFFERENTIAL/PLATELET
Abs Immature Granulocytes: 0.04 10*3/uL (ref 0.00–0.07)
Basophils Absolute: 0 10*3/uL (ref 0.0–0.1)
Basophils Relative: 0 %
Eosinophils Absolute: 0 10*3/uL (ref 0.0–0.5)
Eosinophils Relative: 0 %
HCT: 42.7 % (ref 39.0–52.0)
Hemoglobin: 14.7 g/dL (ref 13.0–17.0)
Immature Granulocytes: 0 %
Lymphocytes Relative: 14 %
Lymphs Abs: 1.4 10*3/uL (ref 0.7–4.0)
MCH: 30.8 pg (ref 26.0–34.0)
MCHC: 34.4 g/dL (ref 30.0–36.0)
MCV: 89.5 fL (ref 80.0–100.0)
Monocytes Absolute: 0.5 10*3/uL (ref 0.1–1.0)
Monocytes Relative: 5 %
Neutro Abs: 7.8 10*3/uL — ABNORMAL HIGH (ref 1.7–7.7)
Neutrophils Relative %: 81 %
Platelets: 351 10*3/uL (ref 150–400)
RBC: 4.77 MIL/uL (ref 4.22–5.81)
RDW: 11.8 % (ref 11.5–15.5)
WBC: 9.8 10*3/uL (ref 4.0–10.5)
nRBC: 0 % (ref 0.0–0.2)

## 2022-11-18 LAB — COMPREHENSIVE METABOLIC PANEL
ALT: 62 U/L — ABNORMAL HIGH (ref 0–44)
AST: 62 U/L — ABNORMAL HIGH (ref 15–41)
Albumin: 3.5 g/dL (ref 3.5–5.0)
Alkaline Phosphatase: 93 U/L (ref 38–126)
Anion gap: 8 (ref 5–15)
BUN: 19 mg/dL (ref 8–23)
CO2: 28 mmol/L (ref 22–32)
Calcium: 9.7 mg/dL (ref 8.9–10.3)
Chloride: 92 mmol/L — ABNORMAL LOW (ref 98–111)
Creatinine, Ser: 0.73 mg/dL (ref 0.61–1.24)
GFR, Estimated: >60 mL/min (ref >60)
Glucose, Bld: 234 mg/dL — ABNORMAL HIGH (ref 70–99)
Potassium: 4.5 mmol/L (ref 3.5–5.1)
Sodium: 128 mmol/L — ABNORMAL LOW (ref 135–145)
Total Bilirubin: 0.7 mg/dL (ref 0.3–1.2)
Total Protein: 8.2 g/dL — ABNORMAL HIGH (ref 6.5–8.1)

## 2022-11-18 LAB — PSA: Prostatic Specific Antigen: 111.8 ng/mL — ABNORMAL HIGH (ref 0.00–4.00)

## 2022-11-18 MED ORDER — DEXAMETHASONE 4 MG PO TABS: ORAL_TABLET | ORAL | 1 refills | Status: DC

## 2022-11-18 MED ORDER — PROCHLORPERAZINE MALEATE 10 MG PO TABS: 10.0000 mg | ORAL_TABLET | Freq: Four times a day (QID) | ORAL | 1 refills | Status: DC | PRN

## 2022-11-18 MED ORDER — ONDANSETRON HCL 8 MG PO TABS: 8.0000 mg | ORAL_TABLET | Freq: Three times a day (TID) | ORAL | 1 refills | Status: DC | PRN

## 2022-11-18 NOTE — Assessment & Plan Note (Signed)
Will check hepatitis panel at the next visit.

## 2022-11-18 NOTE — Progress Notes (Signed)
START ON PATHWAY REGIMEN - Prostate     Darolutamide: A cycle is every 28 days:     Darolutamide    Docetaxel cycles 1 through 6: A cycle is every 21 days:     Docetaxel   **Always confirm dose/schedule in your pharmacy ordering system**  Patient Characteristics: Adenocarcinoma, Recurrent/New Systemic Disease (Including Biochemical Recurrence), Non-Castrate, M1 Histology: Adenocarcinoma Therapeutic Status: Recurrent/New Systemic Disease (Including Biochemical Recurrence) Intent of Therapy: Non-Curative / Palliative Intent, Discussed with Patient 

## 2022-11-18 NOTE — Progress Notes (Signed)
Patient here for initial oncology appointment, expresses  of constipation and painful urination

## 2022-11-18 NOTE — Assessment & Plan Note (Signed)
Asked patient to update me about the pain medication he is taking.

## 2022-11-18 NOTE — Progress Notes (Signed)
Hematology/Oncology Consult Note [phone] [phone]     REFERRING PROVIDER: Riki Altes, MD    CHIEF COMPLAINTS/PURPOSE OF CONSULTATION:  Prostate cancer  ASSESSMENT & PLAN:   Cancer Staging  Prostate cancer Staging form: Prostate, AJCC 8th Edition - Clinical stage from 11/18/2022: Stage IVB (cTX, cM1, PSA: 44, Grade Group: 4) - Signed by [name], MD on 11/18/2022   Prostate cancer Pathological report and imaging results were reviewed and discussed with patient. 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 [employer], high-volume disease. Discussed with the patient about energy deprivation therapy, options of chemotherapy with docetaxel +/- antigen pathway inhibitors. I explained to the patient the risks and benefits of docetaxel 75 mg/m2 to 3 weeks x 6 cycles chemotherapy including all but not limited to infusion reaction, hair loss, hearing loss, mouth sore, nausea, vomiting, low blood counts, bleeding, heart failure, kidney failure, neuropathy and risk of life threatening infection and even death, secondary malignancy etc.   Consider adding Darolutamide concurrently or following chemotherapy. Patient voices understanding and agrees with the plan of chemotherapy.  # Chemotherapy education; we discussed about option of Medi port placement.  Patient prefers to utilize peripheral at IV access Antiemetics-Zofran and Compazine; EMLA cream sent to pharmacy Supportive care measures are necessary for patient well-being and will be provided as necessary. We spent sufficient time to discuss many aspect of care, questions were answered to patient's satisfaction.  Check baseline CBC, CMP, PSA  Goals of care, counseling/discussion Discussed with patient and family.  He understands that his condition is not curable.  Treatment is with palliative intent.  Hyponatremia Unknown etiology.  This appears to be a chronic issue for  him. Possibly secondary to hyperglycemia  Transaminitis Will check hepatitis panel at the next visit.  Neoplasm related pain Asked patient to update me about the pain medication he is taking.    Orders Placed This Encounter  Procedures   Comprehensive metabolic panel    Standing Status:   Future    Number of Occurrences:   1    Standing Expiration Date:   11/18/2023   CBC with Differential/Platelet    Standing Status:   Future    Number of Occurrences:   1    Standing Expiration Date:   11/18/2023   PSA    Standing Status:   Future    Number of Occurrences:   1    Standing Expiration Date:   11/18/2023   CBC with Differential (Cancer Center Only)    Standing Status:   Future    Standing Expiration Date:   11/30/2023   CMP (Cancer Center only)    Standing Status:   Future    Standing Expiration Date:   11/30/2023   Follow-up in 1 to 2 weeks to start for cycle of chemotherapy treatments. All questions were answered. The patient knows to call the clinic with any problems, questions or concerns.  [name], MD, PhD [facility] Hematology Oncology 11/18/2022    HISTORY OF PRESENTING ILLNESS:  Nicolas Gonzales 72 y.o. male presents to establish care for  I have reviewed his chart and materials related to his cancer extensively and collaborated history with the patient. Summary of oncologic history is as follows: Oncology History  Prostate cancer  10/11/2022 Initial Diagnosis   Prostate cancer  Patient was noted to have a PSA of 44.  Patient had urology workup and status post prostate biopsy. 10/11/2022, prostate biopsy showed [A] PROSTATE, LEFT  BASE:   ACINAR ADENOCARCINOMA, GLEASON 3+4=7  (GG2), INVOLVING 1 OF 1 CORES, MEASURING 14  MM ( 93%). 30% GLEASON PATTERN 4; PERINEURAL INVASION PRESENT  [B] PROSTATE, LEFT MID:   ACINAR ADENOCARCINOMA, GLEASON 3+4=7  (GG 2),INVOLVING 2 OF 2 CORES, MEASURING 26  MM ( 96%). 20% GLEASON PATTERN 4;CRIBRIFORM PATTERN 4 AND PERINEURAL INVASION  PRESENT  [C] PROSTATE, LEFT APEX:   ACINAR ADENOCARCINOMA, GLEASON 3+4=7  (GG2), INVOLVING 2 OF 3 CORES, MEASURING 17  MM ( 77%). 20% GLEASON PATTERN 4; CRIBRIFORM PATTERN 4 AND PERINEURAL INVASION PRESENT  [D] PROSTATE, RIGHT BASE:   ACINAR ADENOCARCINOMA, GLEASON 4+4=8  (GG4), INVOLVING 3 OF 3 CORES, MEASURING 15  MM ( 68%). CRIBRIFORM PATTERN 4 AND PERINEURAL INVASION PRESENT  [E] PROSTATE, RIGHT MID:   ACINAR ADENOCARCINOMA, GLEASON 4+3=7  (GG3), INVOLVING 1 OF 3 CORES, MEASURING 12  MM ( 50%). 80% GLEASON PATTERN 4; CRIBRIFORM PATTERN 4 PRESENT  [F] PROSTATE, RIGHT APEX:   ACINAR ADENOCARCINOMA, GLEASON 4+3=7  (GG3), INVOLVING 4 OF 5 CORES, MEASURING 23  MM ( 70%). 80% GLEASON PATTERN 4; TERTIARY PATTERN 5; CRIBRIFORM PATTERN 4 AND PNI PRESEN      11/09/2022 Imaging   PMSA PET scan showed 1. Locally advanced prostate primary with nodal metastasis in the chest, abdomen, and pelvis. 2. Diffuse, large volume osseous metastasis. 3. Incidental findings, including: Coronary artery atherosclerosis. Aortic Atherosclerosis (ICD10-I70.0). Possible constipation.   11/18/2022 Cancer Staging   Staging form: Prostate, AJCC 8th Edition - Clinical stage from 11/18/2022: Stage IVB (cTX, cM1, PSA: 44, Grade Group: 4) - Signed by [name], MD on 11/18/2022 Stage prefix: Initial diagnosis Prostate specific antigen (PSA) range: 20 or greater Gleason score: 8 Histologic grading system: 5 grade system   11/30/2022 -  Chemotherapy   Patient is on Treatment Plan : PROSTATE Docetaxel (75) q21d      Patient reports left hip pain radiating down to left lower extremity.  He is unsure about the medication he was taking for pain.  Naproxen and Mobic were listed on his medication list.  He will check and update me. Family history positive for brother with prostate cancer.  Today he was accompanied by patient's son Patient is active for his age.  He goes to gym 1-2 times per week.   MEDICAL HISTORY:  Past Medical  History:  Diagnosis Date   BPH (benign prostatic hyperplasia)    Diabetes mellitus without complication    Elevated PSA    Hypertension     SURGICAL HISTORY: Past Surgical History:  Procedure Laterality Date   NO PAST SURGERIES     PROSTATE BIOPSY N/A 10/11/2022   Procedure: PROSTATE BIOPSY;  Surgeon: Riki Altes, MD;  Location: [facility] ORS;  Service: Urology;  Laterality: N/A;   TRANSRECTAL ULTRASOUND N/A 10/11/2022   Procedure: TRANSRECTAL ULTRASOUND;  Surgeon: Riki Altes, MD;  Location: [facility] ORS;  Service: Urology;  Laterality: N/A;    SOCIAL HISTORY: Social History   Socioeconomic History   Marital status: Married    Spouse name: Not on file   Number of children: Not on file   Years of education: Not on file   Highest education level: Not on file  Occupational History   Not on file  Tobacco Use   Smoking status: Never   Smokeless tobacco: Never  Vaping Use   Vaping Use: Not on file  Substance and Sexual Activity   Alcohol use: Never   Drug use: Never   Sexual activity: Not on  file  Other Topics Concern   Not on file  Social History Narrative   Not on file   Social Determinants of Health   Financial Resource Strain: Not on file  Food Insecurity: No Food Insecurity (11/18/2022)   Hunger Vital Sign    Worried About Running Out of Food in the Last Year: Never true    Ran Out of Food in the Last Year: Never true  Transportation Needs: No Transportation Needs (11/18/2022)   PRAPARE - Administrator, Civil Service (Medical): No    Lack of Transportation (Non-Medical): No  Physical Activity: Not on file  Stress: Not on file  Social Connections: Not on file  Intimate Partner Violence: Not At Risk (11/18/2022)   Humiliation, Afraid, Rape, and Kick questionnaire    Fear of Current or Ex-Partner: No    Emotionally Abused: No    Physically Abused: No    Sexually Abused: No    FAMILY HISTORY: Family History  Problem Relation Age of Onset    Breast cancer Mother    Alzheimer's disease Father    Prostate cancer Brother     ALLERGIES:  has No Known Allergies.  MEDICATIONS:  Current Outpatient Medications  Medication Sig Dispense Refill   Cholecalciferol (VITAMIN D-1000 MAX ST) 25 MCG (1000 UT) tablet Take 1,000 Units by mouth daily.     CINNAMON PO Take 1 tablet by mouth daily at 6 (six) AM.     COD LIVER OIL PO Take 1 tablet by mouth daily at 6 (six) AM.     cyanocobalamin (VITAMIN B12) 500 MCG tablet Take 1 tablet by mouth daily.     Ferrous Sulfate (IRON PO) Take 1 tablet by mouth daily at 6 (six) AM.     FIBER PO Take 1 tablet by mouth daily at 6 (six) AM.     losartan (COZAAR) 25 MG tablet Take 1 tablet by mouth every morning.     meloxicam (MOBIC) 15 MG tablet Take by mouth.     metFORMIN (GLUCOPHAGE) 500 MG tablet Take 500 mg by mouth every morning.     Multiple Vitamins-Minerals (ZINC PO) Take 1 tablet by mouth daily at 6 (six) AM.     naproxen sodium (ALEVE) 220 MG tablet Take 220 mg by mouth.     tamsulosin (FLOMAX) 0.4 MG CAPS capsule Take 1 capsule (0.4 mg total) by mouth daily. (Patient not taking: Reported on 11/18/2022) 30 capsule 1   No current facility-administered medications for this visit.    Review of Systems  Constitutional:  Positive for fatigue. Negative for appetite change, chills, fever and unexpected weight change.  HENT:   Negative for hearing loss and voice change.   Eyes:  Negative for eye problems and icterus.  Respiratory:  Negative for chest tightness, cough and shortness of breath.   Cardiovascular:  Negative for chest pain and leg swelling.  Gastrointestinal:  Negative for abdominal distention and abdominal pain.  Endocrine: Negative for hot flashes.  Genitourinary:  Negative for difficulty urinating, dysuria and frequency.   Musculoskeletal:  Negative for arthralgias.       Left hip pain  Skin:  Negative for itching and rash.  Neurological:  Negative for light-headedness and  numbness.  Hematological:  Negative for adenopathy. Does not bruise/bleed easily.  Psychiatric/Behavioral:  Negative for confusion.      PHYSICAL EXAMINATION: ECOG PERFORMANCE STATUS: 1 - Symptomatic but completely ambulatory  Vitals:   11/18/22 0937 11/18/22 0941  BP: (!) 142/89 135/82  Pulse: 86   Resp: 16   Temp: (!) 95 F (35 C)   SpO2: 100%    Filed Weights   11/18/22 0937  Weight: 144 lb (65.3 kg)    Physical Exam Constitutional:      General: He is not in acute distress.    Appearance: He is not diaphoretic.  HENT:     Head: Normocephalic and atraumatic.     Nose: Nose normal.     Mouth/Throat:     Pharynx: No oropharyngeal exudate.  Eyes:     General: No scleral icterus.    Pupils: Pupils are equal, round, and reactive to light.  Cardiovascular:     Rate and Rhythm: Normal rate and regular rhythm.     Heart sounds: No murmur heard. Pulmonary:     Effort: Pulmonary effort is normal. No respiratory distress.     Breath sounds: No rales.  Chest:     Chest wall: No tenderness.  Abdominal:     General: There is no distension.     Palpations: Abdomen is soft.     Tenderness: There is no abdominal tenderness.  Musculoskeletal:        General: Normal range of motion.     Cervical back: Normal range of motion and neck supple.  Skin:    General: Skin is warm and dry.     Findings: No erythema.  Neurological:     Mental Status: He is alert and oriented to person, place, and time.     Cranial Nerves: No cranial nerve deficit.     Motor: No abnormal muscle tone.     Coordination: Coordination normal.  Psychiatric:        Mood and Affect: Affect normal.      LABORATORY DATA:  I have reviewed the data as listed    Latest Ref Rng & Units 11/18/2022   10:29 AM 03/29/2022    8:22 AM  CBC  WBC 4.0 - 10.5 K/uL 9.8  4.4   Hemoglobin 13.0 - 17.0 g/dL 29.5  62.1   Hematocrit 39.0 - 52.0 % 42.7  43.9   Platelets 150 - 400 K/uL 351  215       Latest Ref Rng &  Units 11/18/2022   10:29 AM 03/29/2022    8:22 AM  CMP  Glucose 70 - 99 mg/dL 308  657   BUN 8 - 23 mg/dL 19  13   Creatinine 8.46 - 1.24 mg/dL 9.62  9.52   Sodium 841 - 145 mmol/L 128  136   Potassium 3.5 - 5.1 mmol/L 4.5  4.2   Chloride 98 - 111 mmol/L 92  103   CO2 22 - 32 mmol/L 28  27   Calcium 8.9 - 10.3 mg/dL 9.7  9.9   Total Protein 6.5 - 8.1 g/dL 8.2    Total Bilirubin 0.3 - 1.2 mg/dL 0.7    Alkaline Phos 38 - 126 U/L 93    AST 15 - 41 U/L 62    ALT 0 - 44 U/L 62       RADIOGRAPHIC STUDIES: I have personally reviewed the radiological images as listed and agreed with the findings in the report. NM PET (PSMA) SKULL TO MID THIGH  Result Date: 11/09/2022 CLINICAL DATA:  Staging of high-risk prostate cancer. PSA of 44 with abnormal digital rectal exam. EXAM: NUCLEAR MEDICINE PET SKULL BASE TO THIGH TECHNIQUE: 9.4 mCi F18 Piflufolastat (Pylarify) was injected intravenously. Full-ring PET imaging was performed from the skull  base to thigh after the radiotracer. CT data was obtained and used for attenuation correction and anatomic localization. COMPARISON:  03/29/2022 stone study FINDINGS: NECK No radiotracer activity in neck lymph nodes. Incidental CT finding: None. CHEST No hypermetabolic pulmonary nodules. Posterior mediastinal hypermetabolic nodes, including at 9 mm and a S.U.V. max of 13.9 on 74/4. Incidental CT finding: Aortic and coronary artery calcification. Upper normal ascending aortic caliber 3.9 cm. Anteromedial right upper lobe scarring. ABDOMEN/PELVIS Prostate: Diffuse, bilateral prostatic tracer affinity with probable transcapsular extension as evidenced by an irregular prostatic contour. Example at a S.U.V. max of 22.8. Lymph nodes: Abdominal retroperitoneal nodes including a left periaortic node of 1.5 cm and a S.U.V. max of 16.9 on 124/4. Bilateral hypermetabolic pelvic adenopathy, with an index right external iliac node or conglomeration of nodes measuring 3.1 cm and a  S.U.V. max of 19.9 on 157/4. Liver: No evidence of liver metastasis. Incidental CT finding: Normal adrenal glands. No renal calculi or hydronephrosis. Colonic stool burden suggests constipation. Abdominal aortic atherosclerosis. SKELETON Diffuse osseous metastasis, many of which are subtly lytic. Example within the L2 vertebral body at a S.U.V. max of 22.3. Within the left iliac, faintly lytic at 1.5 cm and a S.U.V. max of 15.8 on 148/4. IMPRESSION: 1. Locally advanced prostate primary with nodal metastasis in the chest, abdomen, and pelvis. 2. Diffuse, large volume osseous metastasis. 3. Incidental findings, including: Coronary artery atherosclerosis. Aortic Atherosclerosis (ICD10-I70.0). Possible constipation. Electronically Signed   By: Jeronimo Greaves M.D.   On: 11/09/2022 10:41

## 2022-11-18 NOTE — Assessment & Plan Note (Addendum)
Unknown etiology.  This appears to be a chronic issue for him. Possibly secondary to hyperglycemia

## 2022-11-18 NOTE — Assessment & Plan Note (Addendum)
Pathological report and imaging results were reviewed and discussed with patient. 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 [employer], high-volume disease. Discussed with the patient about energy deprivation therapy, options of chemotherapy with docetaxel +/- antigen pathway inhibitors. I explained to the patient the risks and benefits of docetaxel 75 mg/m2 to 3 weeks x 6 cycles chemotherapy including all but not limited to infusion reaction, hair loss, hearing loss, mouth sore, nausea, vomiting, low blood counts, bleeding, heart failure, kidney failure, neuropathy and risk of life threatening infection and even death, secondary malignancy etc.   Consider adding Darolutamide concurrently or following chemotherapy. Patient voices understanding and agrees with the plan of chemotherapy.  # Chemotherapy education; we discussed about option of Medi port placement.  Patient prefers to utilize peripheral at IV access Antiemetics-Zofran and Compazine; EMLA cream sent to pharmacy Supportive care measures are necessary for patient well-being and will be provided as necessary. We spent sufficient time to discuss many aspect of care, questions were answered to patient's satisfaction.  Check baseline CBC, CMP, PSA

## 2022-11-18 NOTE — Assessment & Plan Note (Signed)
Discussed with patient and family.  He understands that his condition is not curable.  Treatment is with palliative intent.

## 2022-11-19 ENCOUNTER — Other Ambulatory Visit: Payer: Self-pay

## 2022-11-21 ENCOUNTER — Telehealth: Payer: Self-pay | Admitting: *Deleted

## 2022-11-21 ENCOUNTER — Inpatient Hospital Stay: Payer: PPO

## 2022-11-21 DIAGNOSIS — C61 Malignant neoplasm of prostate: Secondary | ICD-10-CM

## 2022-11-21 MED ORDER — DEGARELIX ACETATE(240 MG DOSE) 120 MG/VIAL ~~LOC~~ SOLR
240.0000 mg | Freq: Once | SUBCUTANEOUS | Status: AC
  Administered 2022-11-21: 240 mg via SUBCUTANEOUS
  Filled 2022-11-21: qty 6

## 2022-11-21 NOTE — Telephone Encounter (Signed)
I spoke with patient. I discussed the options for medication as provided by Dr. Cathie Hoops. He will contact his primary care back to arrange for a corticosteroid injection and hold the prednisone. He thanked me for calling him. He asked me to call his son brandon to discuss the plan. I attempted to reach pt's son, but I was unable to leave a vm on [name]'s cell phone.

## 2022-11-21 NOTE — Telephone Encounter (Signed)
I spoke with patient today when he came for the [facility]. Pt/pt's son wanted to make Dr. Cathie Hoops aware that Dr. Laural Benes prescribed prednisone for pt's sciatic pain. Prednisone- Take 60 mg by mouth daily with breakfast. Take 3 tabs by mouth in am x 5 days, then 2 tablets in am x 5 days, then 1 tab in 5 days.  Per Son, pt was offered a cortisone injection but patient opted for the oral prednisone instead. Pt/family concerned that he is also to start decadron next week with this chemo. He is supposed to start the dex next Tuesday and treatment on Wednesday. Pt would not be finished with the prednisone taper.  Please advice patient whether to stop/hold the prednisone prior to treatment vs proceed with cortisone injection instead. Pt/son doesn't want pt to be on 2 different steroids.  If possible, patient wants to have an answer after chemotherapy class today.  Pt is also interested in seeing Josh Borders in near future to discuss pain management.

## 2022-11-29 MED FILL — Dexamethasone Sodium Phosphate Inj 100 MG/10ML: INTRAMUSCULAR | Qty: 1 | Status: AC

## 2022-11-30 ENCOUNTER — Encounter: Payer: Self-pay | Admitting: Oncology

## 2022-11-30 ENCOUNTER — Other Ambulatory Visit: Payer: Self-pay | Admitting: Oncology

## 2022-11-30 ENCOUNTER — Inpatient Hospital Stay (HOSPITAL_BASED_OUTPATIENT_CLINIC_OR_DEPARTMENT_OTHER): Payer: PPO | Admitting: Oncology

## 2022-11-30 ENCOUNTER — Inpatient Hospital Stay: Payer: PPO | Attending: Oncology

## 2022-11-30 ENCOUNTER — Inpatient Hospital Stay: Payer: PPO

## 2022-11-30 VITALS — BP 148/92 | HR 76 | Temp 97.0°F | Resp 18

## 2022-11-30 VITALS — BP 122/80 | HR 87 | Temp 95.0°F | Resp 18 | Wt 131.4 lb

## 2022-11-30 DIAGNOSIS — Z91148 Patient's other noncompliance with medication regimen for other reason: Secondary | ICD-10-CM | POA: Diagnosis not present

## 2022-11-30 DIAGNOSIS — L03316 Cellulitis of umbilicus: Secondary | ICD-10-CM | POA: Insufficient documentation

## 2022-11-30 DIAGNOSIS — Z79899 Other long term (current) drug therapy: Secondary | ICD-10-CM | POA: Diagnosis not present

## 2022-11-30 DIAGNOSIS — C61 Malignant neoplasm of prostate: Secondary | ICD-10-CM

## 2022-11-30 DIAGNOSIS — I10 Essential (primary) hypertension: Secondary | ICD-10-CM | POA: Diagnosis not present

## 2022-11-30 DIAGNOSIS — E1165 Type 2 diabetes mellitus with hyperglycemia: Secondary | ICD-10-CM | POA: Diagnosis not present

## 2022-11-30 DIAGNOSIS — C7951 Secondary malignant neoplasm of bone: Secondary | ICD-10-CM | POA: Insufficient documentation

## 2022-11-30 DIAGNOSIS — E871 Hypo-osmolality and hyponatremia: Secondary | ICD-10-CM | POA: Diagnosis not present

## 2022-11-30 DIAGNOSIS — G893 Neoplasm related pain (acute) (chronic): Secondary | ICD-10-CM

## 2022-11-30 DIAGNOSIS — Z5111 Encounter for antineoplastic chemotherapy: Secondary | ICD-10-CM | POA: Insufficient documentation

## 2022-11-30 DIAGNOSIS — R7401 Elevation of levels of liver transaminase levels: Secondary | ICD-10-CM

## 2022-11-30 LAB — CMP (CANCER CENTER ONLY)
ALT: 86 U/L — ABNORMAL HIGH (ref 0–44)
AST: 59 U/L — ABNORMAL HIGH (ref 15–41)
Albumin: 3.4 g/dL — ABNORMAL LOW (ref 3.5–5.0)
Alkaline Phosphatase: 121 U/L (ref 38–126)
Anion gap: 7 (ref 5–15)
BUN: 18 mg/dL (ref 8–23)
CO2: 31 mmol/L (ref 22–32)
Calcium: 9.4 mg/dL (ref 8.9–10.3)
Chloride: 95 mmol/L — ABNORMAL LOW (ref 98–111)
Creatinine: 0.85 mg/dL (ref 0.61–1.24)
GFR, Estimated: >60 mL/min (ref >60)
Glucose, Bld: 186 mg/dL — ABNORMAL HIGH (ref 70–99)
Potassium: 4.3 mmol/L (ref 3.5–5.1)
Sodium: 133 mmol/L — ABNORMAL LOW (ref 135–145)
Total Bilirubin: 0.6 mg/dL (ref 0.3–1.2)
Total Protein: 7.6 g/dL (ref 6.5–8.1)

## 2022-11-30 LAB — CBC WITH DIFFERENTIAL (CANCER CENTER ONLY)
Abs Immature Granulocytes: 0.04 10*3/uL (ref 0.00–0.07)
Basophils Absolute: 0 10*3/uL (ref 0.0–0.1)
Basophils Relative: 0 %
Eosinophils Absolute: 0 10*3/uL (ref 0.0–0.5)
Eosinophils Relative: 0 %
HCT: 47.4 % (ref 39.0–52.0)
Hemoglobin: 15.8 g/dL (ref 13.0–17.0)
Immature Granulocytes: 1 %
Lymphocytes Relative: 23 %
Lymphs Abs: 1.8 10*3/uL (ref 0.7–4.0)
MCH: 30.5 pg (ref 26.0–34.0)
MCHC: 33.3 g/dL (ref 30.0–36.0)
MCV: 91.5 fL (ref 80.0–100.0)
Monocytes Absolute: 0.4 10*3/uL (ref 0.1–1.0)
Monocytes Relative: 5 %
Neutro Abs: 5.3 10*3/uL (ref 1.7–7.7)
Neutrophils Relative %: 71 %
Platelet Count: 273 10*3/uL (ref 150–400)
RBC: 5.18 MIL/uL (ref 4.22–5.81)
RDW: 12.3 % (ref 11.5–15.5)
WBC Count: 7.6 10*3/uL (ref 4.0–10.5)
nRBC: 0 % (ref 0.0–0.2)

## 2022-11-30 LAB — PSA: Prostatic Specific Antigen: 53.18 ng/mL — ABNORMAL HIGH (ref 0.00–4.00)

## 2022-11-30 LAB — OSMOLALITY, URINE: Osmolality, Ur: 652 mOsm/kg (ref 300–900)

## 2022-11-30 LAB — OSMOLALITY: Osmolality: 298 mOsm/kg — ABNORMAL HIGH (ref 275–295)

## 2022-11-30 MED ORDER — SODIUM CHLORIDE 0.9 % IV SOLN
60.0000 mg/m2 | Freq: Once | INTRAVENOUS | Status: AC
  Administered 2022-11-30: 101 mg via INTRAVENOUS
  Filled 2022-11-30: qty 10.1

## 2022-11-30 MED ORDER — ONDANSETRON HCL 4 MG/2ML IJ SOLN
8.0000 mg | Freq: Once | INTRAMUSCULAR | Status: AC
  Administered 2022-11-30: 8 mg via INTRAVENOUS
  Filled 2022-11-30: qty 4

## 2022-11-30 MED ORDER — SODIUM CHLORIDE 0.9 % IV SOLN
Freq: Once | INTRAVENOUS | Status: AC
  Filled 2022-11-30: qty 250

## 2022-11-30 MED ORDER — SODIUM CHLORIDE 0.9 % IV SOLN
15.0000 mg | Freq: Once | INTRAVENOUS | Status: AC
  Administered 2022-11-30: 15 mg via INTRAVENOUS
  Filled 2022-11-30: qty 1.5

## 2022-11-30 NOTE — Assessment & Plan Note (Signed)
Unknown etiology.  This appears to be a chronic issue for him. Possibly secondary to hyperglycemia Sodium level has improved.  Check serum osmolarity, urine osmolarity, urine sodium.

## 2022-11-30 NOTE — Patient Instructions (Addendum)
[facility] [place] [facility]  Discharge Instructions: Thank you for choosing [facility] to provide your oncology and hematology care.  If you have a lab appointment with the Cancer Center, please go directly to the Cancer Center and check in at the registration area.  Wear comfortable clothing and clothing appropriate for easy access to any Portacath or PICC line.   We strive to give you quality time with your provider. You may need to reschedule your appointment if you arrive late (15 or more minutes).  Arriving late affects you and other patients whose appointments are after yours.  Also, if you miss three or more appointments without notifying the office, you may be dismissed from the clinic at the provider's discretion.      For prescription refill requests, have your pharmacy contact our office and allow 72 hours for refills to be completed.    Today you received the following chemotherapy and/or immunotherapy agents Docetaxel    To help prevent nausea and vomiting after your treatment, we encourage you to take your nausea medication as directed.  BELOW ARE SYMPTOMS THAT SHOULD BE REPORTED IMMEDIATELY: *FEVER GREATER THAN 100.4 F (38 C) OR HIGHER *CHILLS OR SWEATING *NAUSEA AND VOMITING THAT IS NOT CONTROLLED WITH YOUR NAUSEA MEDICATION *UNUSUAL SHORTNESS OF BREATH *UNUSUAL BRUISING OR BLEEDING *URINARY PROBLEMS (pain or burning when urinating, or frequent urination) *BOWEL PROBLEMS (unusual diarrhea, constipation, pain near the anus) TENDERNESS IN MOUTH AND THROAT WITH OR WITHOUT PRESENCE OF ULCERS (sore throat, sores in mouth, or a toothache) UNUSUAL RASH, SWELLING OR PAIN  UNUSUAL VAGINAL DISCHARGE OR ITCHING   Items with * indicate a potential emergency and should be followed up as soon as possible or go to the Emergency Department if any problems should occur.  Please show the CHEMOTHERAPY ALERT CARD or IMMUNOTHERAPY ALERT CARD at check-in to  the Emergency Department and triage nurse.  Should you have questions after your visit or need to cancel or reschedule your appointment, please contact [facility]  [phone] and follow the prompts.  Office hours are 8:00 a.m. to 4:30 p.m. Monday - Friday. Please note that voicemails left after 4:00 p.m. may not be returned until the following business day.  We are closed weekends and major holidays. You have access to a nurse at all times for urgent questions. Please call the main number to the clinic [phone] and follow the prompts.  For any non-urgent questions, you may also contact your provider using MyChart. We now offer e-Visits for anyone 18 and older to request care online for non-urgent symptoms. For details visit [email or web address].   Also download the MyChart app! Go to the app store, search "MyChart", open the app, select [facility], and log in with your MyChart username and password.  Docetaxel Injection What is this medication? DOCETAXEL (doe se TAX el) treats some types of cancer. It works by slowing down the growth of cancer cells. This medicine may be used for other purposes; ask your health care provider or pharmacist if you have questions. COMMON BRAND NAME(S): Docefrez, Taxotere What should I tell my care team before I take this medication? They need to know if you have any of these conditions: Kidney disease Liver disease Low white blood cell levels Tingling of the fingers or toes or other nerve disorder An unusual or allergic reaction to docetaxel, polysorbate 80, other medications, foods, dyes, or preservatives Pregnant or trying to get pregnant Breast-feeding How should   I use this medication? This medication is injected into a vein. It is given by your care team in a hospital or clinic setting. Talk to your care team about the use of this medication in children. Special care may be needed. Overdosage: If you think you  have taken too much of this medicine contact a poison control center or emergency room at once. NOTE: This medicine is only for you. Do not share this medicine with others. What if I miss a dose? Keep appointments for follow-up doses. It is important not to miss your dose. Call your care team if you are unable to keep an appointment. What may interact with this medication? Do not take this medication with any of the following: Live virus vaccines This medication may also interact with the following: Certain antibiotics, such as clarithromycin, telithromycin Certain antivirals for HIV or hepatitis Certain medications for fungal infections, such as itraconazole, ketoconazole, voriconazole Grapefruit juice Nefazodone Supplements, such as St. John's wort This list may not describe all possible interactions. Give your health care provider a list of all the medicines, herbs, non-prescription drugs, or dietary supplements you use. Also tell them if you smoke, drink alcohol, or use illegal drugs. Some items may interact with your medicine. What should I watch for while using this medication? This medication may make you feel generally unwell. This is not uncommon as chemotherapy can affect healthy cells as well as cancer cells. Report any side effects. Continue your course of treatment even though you feel ill unless your care team tells you to stop. You may need blood work done while you are taking this medication. This medication can cause serious side effects and infusion reactions. To reduce the risk, your care team may give you other medications to take before receiving this one. Be sure to follow the directions from your care team. This medication may increase your risk of getting an infection. Call your care team for advice if you get a fever, chills, sore throat, or other symptoms of a cold or flu. Do not treat yourself. Try to avoid being around people who are sick. Avoid taking medications that  contain aspirin, acetaminophen, ibuprofen, naproxen, or ketoprofen unless instructed by your care team. These medications may hide a fever. Be careful brushing or flossing your teeth or using a toothpick because you may get an infection or bleed more easily. If you have any dental work done, tell your dentist you are receiving this medication. Some products may contain alcohol. Ask your care team if this medication contains alcohol. Be sure to tell all care teams you are taking this medicine. Certain medications, like metronidazole and disulfiram, can cause an unpleasant reaction when taken with alcohol. The reaction includes flushing, headache, nausea, vomiting, sweating, and increased thirst. The reaction can last from 30 minutes to several hours. This medication may affect your coordination, reaction time, or judgement. Do not drive or operate machinery until you know how this medication affects you. Sit up or stand slowly to reduce the risk of dizzy or fainting spells. Drinking alcohol with this medication can increase the risk of these side effects. Talk to your care team about your risk of cancer. You may be more at risk for certain types of cancer if you take this medication. Talk to your care team if you wish to become pregnant or think you might be pregnant. This medication can cause serious birth defects if taken during pregnancy or if you get pregnant within 2 months after stopping   therapy. A negative pregnancy test is required before starting this medication. A reliable form of contraception is recommended while taking this medication and for 2 months after stopping it. Talk to your care team about reliable forms of contraception. Do not breast-feed while taking this medication and for 1 week after stopping therapy. Use a condom during sex and for 4 months after stopping therapy. Tell your care team right away if you think your partner might be pregnant. This medication can cause serious birth  defects. This medication may cause infertility. Talk to your care team if you are concerned about your fertility. What side effects may I notice from receiving this medication? Side effects that you should report to your care team as soon as possible: Allergic reactions--skin rash, itching, hives, swelling of the face, lips, tongue, or throat Change in vision such as blurry vision, seeing halos around lights, vision loss Infection--fever, chills, cough, or sore throat Infusion reactions--chest pain, shortness of breath or trouble breathing, feeling faint or lightheaded Low red blood cell level--unusual weakness or fatigue, dizziness, headache, trouble breathing Pain, tingling, or numbness in the hands or feet Painful swelling, warmth, or redness of the skin, blisters or sores at the infusion site Redness, blistering, peeling, or loosening of the skin, including inside the mouth Sudden or severe stomach pain, bloody diarrhea, fever, nausea, vomiting Swelling of the ankles, hands, or feet Tumor lysis syndrome (TLS)--nausea, vomiting, diarrhea, decrease in the amount of urine, dark urine, unusual weakness or fatigue, confusion, muscle pain or cramps, fast or irregular heartbeat, joint pain Unusual bruising or bleeding Side effects that usually do not require medical attention (report to your care team if they continue or are bothersome): Change in nail shape, thickness, or color Change in taste Hair loss Increased tears This list may not describe all possible side effects. Call your doctor for medical advice about side effects. You may report side effects to FDA at 1-800-FDA-1088. Where should I keep my medication? This medication is given in a hospital or clinic. It will not be stored at home. NOTE: This sheet is a summary. It may not cover all possible information. If you have questions about this medicine, talk to your doctor, pharmacist, or health care provider.  2023 [employer]/Gold  Standard (2007-09-08 00:00:00)     

## 2022-11-30 NOTE — Progress Notes (Signed)
Hematology/Oncology Consult Note [phone] [phone]     REFERRING PROVIDER: [name], MD    CHIEF COMPLAINTS/PURPOSE OF CONSULTATION:  Prostate cancer  ASSESSMENT & PLAN:   Cancer Staging  Prostate cancer [facility]) Staging form: Prostate, AJCC 8th Edition - Clinical stage from 11/18/2022: Stage IVB (cTX, cM1, PSA: 44, Grade Group: 4) - Signed by [name], MD on 11/18/2022   Prostate cancer [facility]) Pathological report and imaging results were reviewed and discussed with patient. 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 [employer], high-volume disease. I had a lengthy discussion with patient, his 2 sons regarding importance of medication compliance. I printed out a list of patients medication and discussed in details about the instruction of antiemetics, premedication, pain medication. Patient's son plans to pick up his medication and help patient to follow instructions. Labs are reviewed and discussed with patient. Proceed with Docetaxel, dose reduced to 60 mg/m due to baseline transaminitis. He will receive G-CSF prophylaxis on day 3.  Recommend Claritin 10 mg daily for 4 days.  Hyponatremia Unknown etiology.  This appears to be a chronic issue for him. Possibly secondary to hyperglycemia Sodium level has improved.  Check serum osmolarity, urine osmolarity, urine sodium.  Transaminitis Will check hepatitis panel at the next visit.  Patient denies alcohol use.   Neoplasm related pain Patient has Percocet 5/325 5 mg every 6 hours as needed supply at home. Discussed with instruction with patient.  Patient has both Mobic and naproxen on his medication list and I recommend patient to avoid taking if pain is controlled with narcotics.   Orders Placed This Encounter  Procedures   CBC with Differential (Cancer Center Only)    Standing Status:   Future    Standing Expiration Date:   11/30/2023   CMP (Cancer Center only)     Standing Status:   Future    Standing Expiration Date:   11/30/2023   PSA    Standing Status:   Future    Standing Expiration Date:   12/21/2023   PSA    Standing Status:   Future    Standing Expiration Date:   12/21/2023   CBC with Differential (Cancer Center Only)    Standing Status:   Future    Standing Expiration Date:   12/21/2023   CMP (Cancer Center only)    Standing Status:   Future    Standing Expiration Date:   12/21/2023   PSA    Standing Status:   Future    Standing Expiration Date:   01/11/2024   PSA    Standing Status:   Future    Standing Expiration Date:   01/11/2024   CBC with Differential (Cancer Center Only)    Standing Status:   Future    Standing Expiration Date:   01/11/2024   CMP (Cancer Center only)    Standing Status:   Future    Standing Expiration Date:   01/11/2024   PSA    Standing Status:   Future    Standing Expiration Date:   02/01/2024   PSA    Standing Status:   Future    Standing Expiration Date:   02/01/2024   CBC with Differential (Cancer Center Only)    Standing Status:   Future    Standing Expiration Date:   02/01/2024   CMP (Cancer Center only)    Standing Status:   Future    Standing Expiration Date:   02/01/2024   Follow-up  in  1 week lab MD 3-week lab MD docetaxel with G-CSF. We spent sufficient time to discuss many aspect of care, questions were answered to patient's satisfaction. A total of 45 minutes was spent on this visit.  With 10 minutes spent reviewing image findings, pathology reports, 30 minutes counseling the patient on the diagnosis, goal of care, chemotherapy treatments, side effects of the treatment, management of symptoms, medication instructions.  Additional 5 minutes was spent on answering patient's questions.  All questions were answered. The patient knows to call the clinic with any problems, questions or concerns.  [name], MD, PhD [facility] Hematology Oncology 11/30/2022    HISTORY OF PRESENTING ILLNESS:  Nicolas Gonzales 72 y.o. male presents to establish care for  I have reviewed his chart and materials related to his cancer extensively and collaborated history with the patient. Summary of oncologic history is as follows: Oncology History  Prostate cancer (HCC)  10/11/2022 Initial Diagnosis   Prostate cancer  Patient was noted to have a PSA of 44.  Patient had urology workup and status post prostate biopsy. 10/11/2022, prostate biopsy showed [A] PROSTATE, LEFT BASE:   ACINAR ADENOCARCINOMA, GLEASON 3+4=7  (GG2), INVOLVING 1 OF 1 CORES, MEASURING 14  MM ( 93%). 30% GLEASON PATTERN 4; PERINEURAL INVASION PRESENT  [B] PROSTATE, LEFT MID:   ACINAR ADENOCARCINOMA, GLEASON 3+4=7  (GG 2),INVOLVING 2 OF 2 CORES, MEASURING 26  MM ( 96%). 20% GLEASON PATTERN 4;CRIBRIFORM PATTERN 4 AND PERINEURAL INVASION PRESENT  [C] PROSTATE, LEFT APEX:   ACINAR ADENOCARCINOMA, GLEASON 3+4=7  (GG2), INVOLVING 2 OF 3 CORES, MEASURING 17  MM ( 77%). 20% GLEASON PATTERN 4; CRIBRIFORM PATTERN 4 AND PERINEURAL INVASION PRESENT  [D] PROSTATE, RIGHT BASE:   ACINAR ADENOCARCINOMA, GLEASON 4+4=8  (GG4), INVOLVING 3 OF 3 CORES, MEASURING 15  MM ( 68%). CRIBRIFORM PATTERN 4 AND PERINEURAL INVASION PRESENT  [E] PROSTATE, RIGHT MID:   ACINAR ADENOCARCINOMA, GLEASON 4+3=7  (GG3), INVOLVING 1 OF 3 CORES, MEASURING 12  MM ( 50%). 80% GLEASON PATTERN 4; CRIBRIFORM PATTERN 4 PRESENT  [F] PROSTATE, RIGHT APEX:   ACINAR ADENOCARCINOMA, GLEASON 4+3=7  (GG3), INVOLVING 4 OF 5 CORES, MEASURING 23  MM ( 70%). 80% GLEASON PATTERN 4; TERTIARY PATTERN 5; CRIBRIFORM PATTERN 4 AND PNI PRESEN      11/09/2022 Imaging   PMSA PET scan showed 1. Locally advanced prostate primary with nodal metastasis in the chest, abdomen, and pelvis. 2. Diffuse, large volume osseous metastasis. 3. Incidental findings, including: Coronary artery atherosclerosis. Aortic Atherosclerosis (ICD10-I70.0). Possible constipation.   11/18/2022 Cancer Staging   Staging form: Prostate, AJCC  8th Edition - Clinical stage from 11/18/2022: Stage IVB (cTX, cM1, PSA: 44, Grade Group: 4) - Signed by [name], MD on 11/18/2022 Stage prefix: Initial diagnosis Prostate specific antigen (PSA) range: 20 or greater Gleason score: 8 Histologic grading system: 5 grade system   11/30/2022 -  Chemotherapy   Patient is on Treatment Plan : PROSTATE Docetaxel (75) q21d      Patient reports left hip pain radiating down to left lower extremity. His primary care provider prescribed oxycodone/acetaminophen 5/325 mg every 6 hours as needed.  He has not tried due to concern of side effects. Family history positive for brother with prostate cancer.  Today he was accompanied by 2 of his sons. Patient is active for his age.  He goes to gym 1-2 times per week. Patient attended chemotherapy class.  He did not take dexamethasone as premedication yesterday.  He  has not picked up any of his antiemetic medications as well.  MEDICAL HISTORY:  Past Medical History:  Diagnosis Date   BPH (benign prostatic hyperplasia)    Diabetes mellitus without complication (HCC)    Elevated PSA    Hypertension     SURGICAL HISTORY: Past Surgical History:  Procedure Laterality Date   NO PAST SURGERIES     PROSTATE BIOPSY N/A 10/11/2022   Procedure: PROSTATE BIOPSY;  Surgeon: Riki Altes, MD;  Location: [facility] ORS;  Service: Urology;  Laterality: N/A;   TRANSRECTAL ULTRASOUND N/A 10/11/2022   Procedure: TRANSRECTAL ULTRASOUND;  Surgeon: Riki Altes, MD;  Location: [facility] ORS;  Service: Urology;  Laterality: N/A;    SOCIAL HISTORY: Social History   Socioeconomic History   Marital status: Married    Spouse name: Not on file   Number of children: Not on file   Years of education: Not on file   Highest education level: Not on file  Occupational History   Not on file  Tobacco Use   Smoking status: Never   Smokeless tobacco: Never  Vaping Use   Vaping Use: Not on file  Substance and Sexual Activity   Alcohol  use: Never   Drug use: Never   Sexual activity: Not on file  Other Topics Concern   Not on file  Social History Narrative   Not on file   Social Determinants of Health   Financial Resource Strain: Not on file  Food Insecurity: No Food Insecurity (11/18/2022)   Hunger Vital Sign    Worried About Running Out of Food in the Last Year: Never true    Ran Out of Food in the Last Year: Never true  Transportation Needs: No Transportation Needs (11/18/2022)   PRAPARE - Administrator, Civil Service (Medical): No    Lack of Transportation (Non-Medical): No  Physical Activity: Not on file  Stress: Not on file  Social Connections: Not on file  Intimate Partner Violence: Not At Risk (11/18/2022)   Humiliation, Afraid, Rape, and Kick questionnaire    Fear of Current or Ex-Partner: No    Emotionally Abused: No    Physically Abused: No    Sexually Abused: No    FAMILY HISTORY: Family History  Problem Relation Age of Onset   Breast cancer Mother    Alzheimer's disease Father    Prostate cancer Brother     ALLERGIES:  has No Known Allergies.  MEDICATIONS:  Current Outpatient Medications  Medication Sig Dispense Refill   Cholecalciferol (VITAMIN D-1000 MAX ST) 25 MCG (1000 UT) tablet Take 1,000 Units by mouth daily.     CINNAMON PO Take 1 tablet by mouth daily at 6 (six) AM.     COD LIVER OIL PO Take 1 tablet by mouth daily at 6 (six) AM.     cyanocobalamin (VITAMIN B12) 500 MCG tablet Take 1 tablet by mouth daily.     Ferrous Sulfate (IRON PO) Take 1 tablet by mouth daily at 6 (six) AM.     FIBER PO Take 1 tablet by mouth daily at 6 (six) AM.     Multiple Vitamins-Minerals (ZINC PO) Take 1 tablet by mouth daily at 6 (six) AM.     dexamethasone (DECADRON) 4 MG tablet Take 2 tabs by mouth 2 times daily starting day before chemo. Then take 2 tabs daily for 2 days starting day after chemo. Take with food. (Patient not taking: Reported on 11/30/2022) 30 tablet 1   losartan (COZAAR)  25 MG tablet Take 1 tablet by mouth every morning. (Patient not taking: Reported on 11/30/2022)     meloxicam (MOBIC) 15 MG tablet Take by mouth. (Patient not taking: Reported on 11/30/2022)     metFORMIN (GLUCOPHAGE) 500 MG tablet Take 500 mg by mouth every morning. (Patient not taking: Reported on 11/30/2022)     naproxen sodium (ALEVE) 220 MG tablet Take 220 mg by mouth. (Patient not taking: Reported on 11/30/2022)     ondansetron (ZOFRAN) 8 MG tablet Take 1 tablet (8 mg total) by mouth every 8 (eight) hours as needed for nausea or vomiting. (Patient not taking: Reported on 11/30/2022) 30 tablet 1   prochlorperazine (COMPAZINE) 10 MG tablet Take 1 tablet (10 mg total) by mouth every 6 (six) hours as needed for nausea or vomiting. (Patient not taking: Reported on 11/30/2022) 30 tablet 1   tamsulosin (FLOMAX) 0.4 MG CAPS capsule Take 1 capsule (0.4 mg total) by mouth daily. (Patient not taking: Reported on 11/18/2022) 30 capsule 1   No current facility-administered medications for this visit.    Review of Systems  Constitutional:  Positive for fatigue. Negative for appetite change, chills, fever and unexpected weight change.  HENT:   Negative for hearing loss and voice change.   Eyes:  Negative for eye problems and icterus.  Respiratory:  Negative for chest tightness, cough and shortness of breath.   Cardiovascular:  Negative for chest pain and leg swelling.  Gastrointestinal:  Negative for abdominal distention and abdominal pain.  Endocrine: Negative for hot flashes.  Genitourinary:  Negative for difficulty urinating, dysuria and frequency.   Musculoskeletal:  Negative for arthralgias.       Left hip pain  Skin:  Negative for itching and rash.  Neurological:  Negative for light-headedness and numbness.  Hematological:  Negative for adenopathy. Does not bruise/bleed easily.  Psychiatric/Behavioral:  Negative for confusion.      PHYSICAL EXAMINATION: ECOG PERFORMANCE STATUS: 1 - Symptomatic but  completely ambulatory  Vitals:   11/30/22 0837  BP: 122/80  Pulse: 87  Resp: 18  Temp: (!) 95 F (35 C)  SpO2: 99%   Filed Weights   11/30/22 0837  Weight: 131 lb 6.4 oz (59.6 kg)    Physical Exam Constitutional:      General: He is not in acute distress.    Appearance: He is not diaphoretic.  HENT:     Head: Normocephalic and atraumatic.     Nose: Nose normal.     Mouth/Throat:     Pharynx: No oropharyngeal exudate.  Eyes:     General: No scleral icterus.    Pupils: Pupils are equal, round, and reactive to light.  Cardiovascular:     Rate and Rhythm: Normal rate and regular rhythm.     Heart sounds: No murmur heard. Pulmonary:     Effort: Pulmonary effort is normal. No respiratory distress.     Breath sounds: No rales.  Chest:     Chest wall: No tenderness.  Abdominal:     General: There is no distension.     Palpations: Abdomen is soft.     Tenderness: There is no abdominal tenderness.  Musculoskeletal:        General: Normal range of motion.     Cervical back: Normal range of motion and neck supple.  Skin:    General: Skin is warm and dry.     Findings: No erythema.  Neurological:     Mental Status: He is alert and oriented to  person, place, and time.     Cranial Nerves: No cranial nerve deficit.     Motor: No abnormal muscle tone.     Coordination: Coordination normal.  Psychiatric:        Mood and Affect: Affect normal.      LABORATORY DATA:  I have reviewed the data as listed    Latest Ref Rng & Units 11/30/2022    8:24 AM 11/18/2022   10:29 AM 03/29/2022    8:22 AM  CBC  WBC 4.0 - 10.5 K/uL 7.6  9.8  4.4   Hemoglobin 13.0 - 17.0 g/dL 96.0  45.4  09.8   Hematocrit 39.0 - 52.0 % 47.4  42.7  43.9   Platelets 150 - 400 K/uL 273  351  215       Latest Ref Rng & Units 11/30/2022    8:25 AM 11/18/2022   10:29 AM 03/29/2022    8:22 AM  CMP  Glucose 70 - 99 mg/dL 119  147  829   BUN 8 - 23 mg/dL 18  19  13    Creatinine 0.61 - 1.24 mg/dL 5.62  1.30   8.65   Sodium 135 - 145 mmol/L 133  128  136   Potassium 3.5 - 5.1 mmol/L 4.3  4.5  4.2   Chloride 98 - 111 mmol/L 95  92  103   CO2 22 - 32 mmol/L 31  28  27    Calcium 8.9 - 10.3 mg/dL 9.4  9.7  9.9   Total Protein 6.5 - 8.1 g/dL 7.6  8.2    Total Bilirubin 0.3 - 1.2 mg/dL 0.6  0.7    Alkaline Phos 38 - 126 U/L 121  93    AST 15 - 41 U/L 59  62    ALT 0 - 44 U/L 86  62       RADIOGRAPHIC STUDIES: I have personally reviewed the radiological images as listed and agreed with the findings in the report. NM PET (PSMA) SKULL TO MID THIGH  Result Date: 11/09/2022 CLINICAL DATA:  Staging of high-risk prostate cancer. PSA of 44 with abnormal digital rectal exam. EXAM: NUCLEAR MEDICINE PET SKULL BASE TO THIGH TECHNIQUE: 9.4 mCi F18 Piflufolastat (Pylarify) was injected intravenously. Full-ring PET imaging was performed from the skull base to thigh after the radiotracer. CT data was obtained and used for attenuation correction and anatomic localization. COMPARISON:  03/29/2022 stone study FINDINGS: NECK No radiotracer activity in neck lymph nodes. Incidental CT finding: None. CHEST No hypermetabolic pulmonary nodules. Posterior mediastinal hypermetabolic nodes, including at 9 mm and a S.U.V. max of 13.9 on 74/4. Incidental CT finding: Aortic and coronary artery calcification. Upper normal ascending aortic caliber 3.9 cm. Anteromedial right upper lobe scarring. ABDOMEN/PELVIS Prostate: Diffuse, bilateral prostatic tracer affinity with probable transcapsular extension as evidenced by an irregular prostatic contour. Example at a S.U.V. max of 22.8. Lymph nodes: Abdominal retroperitoneal nodes including a left periaortic node of 1.5 cm and a S.U.V. max of 16.9 on 124/4. Bilateral hypermetabolic pelvic adenopathy, with an index right external iliac node or conglomeration of nodes measuring 3.1 cm and a S.U.V. max of 19.9 on 157/4. Liver: No evidence of liver metastasis. Incidental CT finding: Normal adrenal  glands. No renal calculi or hydronephrosis. Colonic stool burden suggests constipation. Abdominal aortic atherosclerosis. SKELETON Diffuse osseous metastasis, many of which are subtly lytic. Example within the L2 vertebral body at a S.U.V. max of 22.3. Within the left iliac, faintly lytic at 1.5 cm and  a S.U.V. max of 15.8 on 148/4. IMPRESSION: 1. Locally advanced prostate primary with nodal metastasis in the chest, abdomen, and pelvis. 2. Diffuse, large volume osseous metastasis. 3. Incidental findings, including: Coronary artery atherosclerosis. Aortic Atherosclerosis (ICD10-I70.0). Possible constipation. Electronically Signed   By: Jeronimo Greaves M.D.   On: 11/09/2022 10:41

## 2022-11-30 NOTE — Assessment & Plan Note (Addendum)
Pathological report and imaging results were reviewed and discussed with patient. 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 [employer], high-volume disease. I had a lengthy discussion with patient, his 2 sons regarding importance of medication compliance. I printed out a list of patients medication and discussed in details about the instruction of antiemetics, premedication, pain medication. Patient's son plans to pick up his medication and help patient to follow instructions. Labs are reviewed and discussed with patient. Proceed with Docetaxel, dose reduced to 60 mg/m due to baseline transaminitis. He will receive G-CSF prophylaxis on day 3.  Recommend Claritin 10 mg daily for 4 days.

## 2022-11-30 NOTE — Assessment & Plan Note (Addendum)
Will check hepatitis panel at the next visit.  Patient denies alcohol use.

## 2022-11-30 NOTE — Assessment & Plan Note (Addendum)
Patient has Percocet 5/325 5 mg every 6 hours as needed supply at home. Discussed with instruction with patient.  Patient has both Mobic and naproxen on his medication list and I recommend patient to avoid taking if pain is controlled with narcotics.

## 2022-11-30 NOTE — Progress Notes (Signed)
Nutrition Assessment   Reason for Assessment:   Patient identified on Malnutrition Screening report for weight loss, poor appetite   ASSESSMENT:  72 year old male with stage IV prostate cancer.  Past medical history of DM, HTN.  Patient receiving taxotere.    Met with patient and son during infusion.  Patient reports no appetite or taste for food.  Usually drinks a protein drink in the morning (ensure max protein) and has a [employer] [place] bar with nuts, or cereal.  Had soup yesterday for lunch.  Last night for dinner ate meatloaf, squash and pinto beans from [employer].  Patient prepares meals or gets them out.      Nutrition Focused Physical Exam:   Orbital Region: severe Buccal Region: severe Upper Arm Region: unable to assess Thoracic and Lumbar Region: severe Temple Region: severe Clavicle Bone Region: moderate Shoulder and Acromion Bone Region: unable to assess Scapular Bone Region: unable to assess Dorsal Hand: mild Patellar Region: moderate Anterior Thigh Region: moderate Posterior Calf Region: mild Edema (RD assessment): none   Medications: Vit B 12, Vit D, metformin, compazine, zofran, MVI, dexamethasone   Labs: glucose 186, Na 133   Anthropometrics:   Height: 68.5 inches Weight: 131 lb 6.4 oz today UBW: 170-175 lb per patient 155 lb on 10/11/22 BMI: 19  15% weight loss in the last month and half, significant   Estimated Energy Needs  Kcals: 1800-2100 Protein: 90-105 g Fluid: 1800-2100 ml   NUTRITION DIAGNOSIS: Inadequate oral intake related to cancer and cancer related treatment side effects as evidenced by 15% weight loss in the last month and half, severe fat loss and eating less than 50% of energy intake for > 5 days   MALNUTRITION DIAGNOSIS: Patient meets criteria for severe malnutrition in setting of acute illness likely progressing to chronic as evidenced by 15% weight loss in the last month and half, severe fat loss and eating less than 50% of energy  needs for > 5 days   INTERVENTION:  Recommend 350+ calorie shake or higher despite increased sugar in shakes due to severe weight loss and poor po intake Encouraged eating q 2 hours Discussed ways to add calories and protein in the diet.  High Calorie, High Protein Diet handout provided Samples of ensure complete and coupons given to patient.     MONITORING, EVALUATION, GOAL: weight trends, intake   Next Visit: Friday, May 10 during infusion  Alphonso Gregson B. Freida Busman, RD, LDN Registered Dietitian [phone]

## 2022-12-01 ENCOUNTER — Telehealth: Payer: Self-pay

## 2022-12-01 ENCOUNTER — Other Ambulatory Visit: Payer: Self-pay

## 2022-12-01 NOTE — Telephone Encounter (Signed)
Telephone call to patient for follow up after first chemo.   No answer and no voice mail options available.  Unable to leave message.

## 2022-12-01 NOTE — Addendum Note (Signed)
Addended by: [name] on: 12/01/2022 08:13 AM   Modules accepted: Orders

## 2022-12-02 ENCOUNTER — Inpatient Hospital Stay: Payer: PPO

## 2022-12-02 DIAGNOSIS — Z5111 Encounter for antineoplastic chemotherapy: Secondary | ICD-10-CM | POA: Diagnosis not present

## 2022-12-02 DIAGNOSIS — C61 Malignant neoplasm of prostate: Secondary | ICD-10-CM

## 2022-12-02 MED ORDER — PEGFILGRASTIM-CBQV 6 MG/0.6ML ~~LOC~~ SOSY
6.0000 mg | PREFILLED_SYRINGE | Freq: Once | SUBCUTANEOUS | Status: AC
  Administered 2022-12-02: 6 mg via SUBCUTANEOUS
  Filled 2022-12-02: qty 0.6

## 2022-12-06 ENCOUNTER — Other Ambulatory Visit: Payer: Self-pay

## 2022-12-08 NOTE — Addendum Note (Signed)
Addended by: Luz Lex on: 12/08/2022 10:25 AM   Modules accepted: Orders

## 2022-12-09 ENCOUNTER — Inpatient Hospital Stay: Payer: PPO

## 2022-12-09 ENCOUNTER — Encounter: Payer: Self-pay | Admitting: Oncology

## 2022-12-09 ENCOUNTER — Inpatient Hospital Stay (HOSPITAL_BASED_OUTPATIENT_CLINIC_OR_DEPARTMENT_OTHER): Payer: PPO | Admitting: Oncology

## 2022-12-09 VITALS — BP 115/82 | HR 81 | Temp 96.0°F | Wt 122.1 lb

## 2022-12-09 DIAGNOSIS — R7401 Elevation of levels of liver transaminase levels: Secondary | ICD-10-CM

## 2022-12-09 DIAGNOSIS — E871 Hypo-osmolality and hyponatremia: Secondary | ICD-10-CM

## 2022-12-09 DIAGNOSIS — E1165 Type 2 diabetes mellitus with hyperglycemia: Secondary | ICD-10-CM

## 2022-12-09 DIAGNOSIS — C61 Malignant neoplasm of prostate: Secondary | ICD-10-CM

## 2022-12-09 DIAGNOSIS — R634 Abnormal weight loss: Secondary | ICD-10-CM

## 2022-12-09 DIAGNOSIS — L03316 Cellulitis of umbilicus: Secondary | ICD-10-CM | POA: Diagnosis not present

## 2022-12-09 DIAGNOSIS — Z5111 Encounter for antineoplastic chemotherapy: Secondary | ICD-10-CM | POA: Diagnosis not present

## 2022-12-09 LAB — CMP (CANCER CENTER ONLY)
ALT: 138 U/L — ABNORMAL HIGH (ref 0–44)
AST: 72 U/L — ABNORMAL HIGH (ref 15–41)
Albumin: 3.3 g/dL — ABNORMAL LOW (ref 3.5–5.0)
Alkaline Phosphatase: 170 U/L — ABNORMAL HIGH (ref 38–126)
Anion gap: 10 (ref 5–15)
BUN: 32 mg/dL — ABNORMAL HIGH (ref 8–23)
CO2: 30 mmol/L (ref 22–32)
Calcium: 9.6 mg/dL (ref 8.9–10.3)
Chloride: 94 mmol/L — ABNORMAL LOW (ref 98–111)
Creatinine: 0.97 mg/dL (ref 0.61–1.24)
GFR, Estimated: >60 mL/min (ref >60)
Glucose, Bld: 323 mg/dL — ABNORMAL HIGH (ref 70–99)
Potassium: 4.6 mmol/L (ref 3.5–5.1)
Sodium: 134 mmol/L — ABNORMAL LOW (ref 135–145)
Total Bilirubin: 0.5 mg/dL (ref 0.3–1.2)
Total Protein: 7 g/dL (ref 6.5–8.1)

## 2022-12-09 LAB — CBC WITH DIFFERENTIAL (CANCER CENTER ONLY)
Abs Immature Granulocytes: 2.21 10*3/uL — ABNORMAL HIGH (ref 0.00–0.07)
Basophils Absolute: 0 10*3/uL (ref 0.0–0.1)
Basophils Relative: 0 %
Eosinophils Absolute: 0 10*3/uL (ref 0.0–0.5)
Eosinophils Relative: 0 %
HCT: 46.5 % (ref 39.0–52.0)
Hemoglobin: 15.7 g/dL (ref 13.0–17.0)
Immature Granulocytes: 7 %
Lymphocytes Relative: 7 %
Lymphs Abs: 2.2 10*3/uL (ref 0.7–4.0)
MCH: 31 pg (ref 26.0–34.0)
MCHC: 33.8 g/dL (ref 30.0–36.0)
MCV: 91.9 fL (ref 80.0–100.0)
Monocytes Absolute: 1.2 10*3/uL — ABNORMAL HIGH (ref 0.1–1.0)
Monocytes Relative: 4 %
Neutro Abs: 24.3 10*3/uL — ABNORMAL HIGH (ref 1.7–7.7)
Neutrophils Relative %: 82 %
Platelet Count: 131 10*3/uL — ABNORMAL LOW (ref 150–400)
RBC: 5.06 MIL/uL (ref 4.22–5.81)
RDW: 12.7 % (ref 11.5–15.5)
Smear Review: NORMAL
WBC Count: 29.9 10*3/uL — ABNORMAL HIGH (ref 4.0–10.5)
nRBC: 0 % (ref 0.0–0.2)

## 2022-12-09 LAB — HEPATITIS PANEL, ACUTE
HCV Ab: REACTIVE — AB
Hep A IgM: NONREACTIVE
Hep B C IgM: NONREACTIVE
Hepatitis B Surface Ag: NONREACTIVE

## 2022-12-09 MED ORDER — MUPIROCIN 2 % EX OINT: 1.0000 | TOPICAL_OINTMENT | Freq: Two times a day (BID) | CUTANEOUS | 0 refills | Status: DC

## 2022-12-09 MED ORDER — SODIUM CHLORIDE 0.9 % IV SOLN
Freq: Once | INTRAVENOUS | Status: AC
  Filled 2022-12-09: qty 250

## 2022-12-09 MED ORDER — MEGESTROL ACETATE 20 MG PO TABS: 20.0000 mg | ORAL_TABLET | Freq: Two times a day (BID) | ORAL | 0 refills | Status: DC

## 2022-12-09 MED ORDER — AMOXICILLIN-POT CLAVULANATE 875-125 MG PO TABS: 1.0000 | ORAL_TABLET | Freq: Two times a day (BID) | ORAL | 0 refills | Status: DC

## 2022-12-09 NOTE — Assessment & Plan Note (Signed)
Follow up with dietitian Recommend appetite stimulant Megace, start with 20mg  BID

## 2022-12-09 NOTE — Assessment & Plan Note (Addendum)
check hepatitis panel   Patient denies alcohol use.  Worse due to recent chemotherapy. Monitor.

## 2022-12-09 NOTE — Assessment & Plan Note (Signed)
Unknown etiology. chronic issue for him. Sodium level has improved.

## 2022-12-09 NOTE — Progress Notes (Signed)
Nutrition Follow-up:  Patient with stage IV prostate cancer.  Patient receiving taxotere.    Met with patient during fluids.  Patient reports continued no appetite or taste for food.  Yesterday only able to drink an ensure complete, few chips, 2 cookies, 2 bites of cheeseburger and 1 chicken nugget.  MD starting megace today.  Reports that he feels like food is growing in his mouth the longer he chews it.  "I just can't swallow it."  Medications went down better this am.      Medications: megace added  Labs: glucose 323  Anthropometrics:   Weight 122 lb 1.6 oz today (9 lb weight loss in 10 days) 131 lb 6.4 oz on 5/1 UBW of 170-175 lb   NUTRITION DIAGNOSIS: Inadequate oral intake continues   MALNUTRITION DIAGNOSIS: Severe malnutrition continues   INTERVENTION:  Sample of Boost VHC given to patient today (530 calories). Significant weight loss continues Asked patient to check blood glucose on regular basis.  Would prefer medication adjustment vs restricting food intake as intake so poor, significant weight loss and severe malnutrition.   Reviewed ways to add calories to diet Encouraged trying nausea medication to see if able to better get foods down.     MONITORING, EVALUATION, GOAL: weight trends, intake   NEXT VISIT: Wednesday, May 22 during infusion  Timberlyn Pickford B. Freida Busman, RD, LDN Registered Dietitian [phone]

## 2022-12-09 NOTE — Assessment & Plan Note (Signed)
Recommend Augmentin 875/125mg  BID x 7 days. Topical Bactroban Re evaluate next week.

## 2022-12-09 NOTE — Progress Notes (Signed)
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 [employer], high-volume disease. Labs are reviewed and discussed with patient.     Hyponatremia Unknown etiology. chronic issue for him. Sodium level has improved.    Transaminitis check hepatitis panel   Patient denies alcohol use.  Worse due to recent chemotherapy. Monitor.   Uncontrolled diabetes mellitus with hyperglycemia (HCC) Not compliant with medication.   Recommend patient to start on metformin   Weight loss Follow up with dietitian Recommend appetite stimulant Megace, start with 20mg  BID  Cellulitis, umbilical Recommend Augmentin 875/125mg  BID x 7 days. Topical Bactroban Re evaluate next week.     Orders Placed This Encounter  Procedures   CBC with Differential (Cancer Center Only)    Standing Status:   Future    Standing Expiration Date:   12/09/2023   CMP (Cancer Center only)    Standing Status:   Future    Standing Expiration Date:   12/09/2023   Follow-up in  1 week lab NP 3-week lab MD docetaxel with G-CSF. We spent sufficient time to discuss many aspect of care, questions were answered to patient's satisfaction.  [name], MD, PhD [facility] Hematology Oncology 12/09/2022    HISTORY OF PRESENTING ILLNESS:  Nicolas Gonzales 72 y.o. male presents to establish care for  I have reviewed his chart and materials related to his cancer extensively and collaborated history with the patient. Summary of oncologic history is as follows: Oncology History  Prostate  cancer (HCC)  10/11/2022 Initial Diagnosis   Prostate cancer  Patient was noted to have a PSA of 44.  Patient had urology workup and status post prostate biopsy. 10/11/2022, prostate biopsy showed [A] PROSTATE, LEFT BASE:   ACINAR ADENOCARCINOMA, GLEASON 3+4=7  (GG2), INVOLVING 1 OF 1 CORES, MEASURING 14  MM ( 93%). 30% GLEASON PATTERN 4; PERINEURAL INVASION PRESENT  [B] PROSTATE, LEFT MID:   ACINAR ADENOCARCINOMA, GLEASON 3+4=7  (GG 2),INVOLVING 2 OF 2 CORES, MEASURING 26  MM ( 96%). 20% GLEASON PATTERN 4;CRIBRIFORM PATTERN 4 AND PERINEURAL INVASION PRESENT  [C] PROSTATE, LEFT APEX:   ACINAR ADENOCARCINOMA, GLEASON 3+4=7  (GG2), INVOLVING 2 OF 3 CORES, MEASURING 17  MM ( 77%). 20% GLEASON PATTERN 4; CRIBRIFORM PATTERN 4 AND PERINEURAL INVASION PRESENT  [D] PROSTATE, RIGHT BASE:   ACINAR ADENOCARCINOMA, GLEASON 4+4=8  (GG4), INVOLVING 3 OF 3 CORES, MEASURING 15  MM ( 68%). CRIBRIFORM PATTERN 4 AND PERINEURAL INVASION PRESENT  [E] PROSTATE, RIGHT MID:   ACINAR ADENOCARCINOMA, GLEASON 4+3=7  (GG3), INVOLVING 1 OF 3 CORES, MEASURING 12  MM ( 50%). 80% GLEASON PATTERN 4; CRIBRIFORM PATTERN 4 PRESENT  [F] PROSTATE, RIGHT APEX:   ACINAR ADENOCARCINOMA, GLEASON 4+3=7  (GG3), INVOLVING 4 OF 5 CORES, MEASURING 23  MM ( 70%). 80% GLEASON PATTERN 4; TERTIARY PATTERN 5; CRIBRIFORM PATTERN 4 AND PNI PRESEN      11/09/2022 Imaging   PMSA PET scan showed 1. Locally advanced prostate primary with nodal metastasis in the chest, abdomen, and pelvis. 2. Diffuse, large volume osseous metastasis. 3. Incidental findings, including: Coronary  artery atherosclerosis. Aortic Atherosclerosis (ICD10-I70.0). Possible constipation.   11/18/2022 Cancer Staging   Staging form: Prostate, AJCC 8th Edition - Clinical stage from 11/18/2022: Stage IVB (cTX, cM1, PSA: 44, Grade Group: 4) - Signed by [name], MD on 11/18/2022 Stage prefix: Initial diagnosis Prostate specific antigen (PSA) range: 20 or greater Gleason score:  8 Histologic grading system: 5 grade system   11/30/2022 -  Chemotherapy   Patient is on Treatment Plan : PROSTATE Docetaxel (75) q21d      Patient reports left hip pain radiating down to left lower extremity. His primary care provider prescribed oxycodone/acetaminophen 5/325 mg every 6 hours as needed.  He has not tried due to concern of side effects. Family history positive for brother with prostate cancer.  Today he was accompanied by 2 of his sons. Patient is active for his age.  He goes to gym 1-2 times per week.   INTERVAL HISTORY Nicolas Gonzales is a 72 y.o. male who has above history reviewed by me today presents for follow up visit for prostate cancer management.  He tolerated chemotherapy. Mild nausea and diarrhea for 2 hours and spontaneously resolved.  No fever chills.  He has noticed a foul smell of his belly button, associated with discharged. Appetite is not good, he has lost weight.  He is not taking his BP meds as BP has been normal. Not taking his metformin as well.     MEDICAL HISTORY:  Past Medical History:  Diagnosis Date   BPH (benign prostatic hyperplasia)    Diabetes mellitus without complication (HCC)    Elevated PSA    Hypertension     SURGICAL HISTORY: Past Surgical History:  Procedure Laterality Date   NO PAST SURGERIES     PROSTATE BIOPSY N/A 10/11/2022   Procedure: PROSTATE BIOPSY;  Surgeon: Riki Altes, MD;  Location: [facility] ORS;  Service: Urology;  Laterality: N/A;   TRANSRECTAL ULTRASOUND N/A 10/11/2022   Procedure: TRANSRECTAL ULTRASOUND;  Surgeon: Riki Altes, MD;  Location: [facility] ORS;  Service: Urology;  Laterality: N/A;    SOCIAL HISTORY: Social History   Socioeconomic History   Marital status: Married    Spouse name: Not on file   Number of children: Not on file   Years of education: Not on file   Highest education level: Not on file  Occupational History   Not on file  Tobacco Use   Smoking status: Never   Smokeless  tobacco: Never  Vaping Use   Vaping Use: Not on file  Substance and Sexual Activity   Alcohol use: Never   Drug use: Never   Sexual activity: Not on file  Other Topics Concern   Not on file  Social History Narrative   Not on file   Social Determinants of Health   Financial Resource Strain: Not on file  Food Insecurity: No Food Insecurity (11/18/2022)   Hunger Vital Sign    Worried About Running Out of Food in the Last Year: Never true    Ran Out of Food in the Last Year: Never true  Transportation Needs: No Transportation Needs (11/18/2022)   PRAPARE - Administrator, Civil Service (Medical): No    Lack of Transportation (Non-Medical): No  Physical Activity: Not on file  Stress: Not on file  Social Connections: Not on file  Intimate Partner Violence: Not At Risk (11/18/2022)   Humiliation, Afraid, Rape, and Kick questionnaire    Fear of Current or Ex-Partner: No  Emotionally Abused: No    Physically Abused: No    Sexually Abused: No    FAMILY HISTORY: Family History  Problem Relation Age of Onset   Breast cancer Mother    Alzheimer's disease Father    Prostate cancer Brother     ALLERGIES:  has No Known Allergies.  MEDICATIONS:  Current Outpatient Medications  Medication Sig Dispense Refill   amoxicillin-clavulanate (AUGMENTIN) 875-125 MG tablet Take 1 tablet by mouth 2 (two) times daily. 14 tablet 0   Cholecalciferol (VITAMIN D-1000 MAX ST) 25 MCG (1000 UT) tablet Take 1,000 Units by mouth daily.     CINNAMON PO Take 1 tablet by mouth daily at 6 (six) AM.     COD LIVER OIL PO Take 1 tablet by mouth daily at 6 (six) AM.     cyanocobalamin (VITAMIN B12) 500 MCG tablet Take 1 tablet by mouth daily.     Ferrous Sulfate (IRON PO) Take 1 tablet by mouth daily at 6 (six) AM.     FIBER PO Take 1 tablet by mouth daily at 6 (six) AM.     megestrol (MEGACE) 20 MG tablet Take 1 tablet (20 mg total) by mouth 2 (two) times daily. 30 tablet 0   Multiple  Vitamins-Minerals (ZINC PO) Take 1 tablet by mouth daily at 6 (six) AM.     mupirocin ointment (BACTROBAN) 2 % Apply 1 Application topically 2 (two) times daily. 30 g 0   dexamethasone (DECADRON) 4 MG tablet Take 2 tabs by mouth 2 times daily starting day before chemo. Then take 2 tabs daily for 2 days starting day after chemo. Take with food. (Patient not taking: Reported on 11/30/2022) 30 tablet 1   losartan (COZAAR) 25 MG tablet Take 1 tablet by mouth every morning. (Patient not taking: Reported on 11/30/2022)     metFORMIN (GLUCOPHAGE) 500 MG tablet Take 500 mg by mouth every morning. (Patient not taking: Reported on 11/30/2022)     ondansetron (ZOFRAN) 8 MG tablet Take 1 tablet (8 mg total) by mouth every 8 (eight) hours as needed for nausea or vomiting. (Patient not taking: Reported on 11/30/2022) 30 tablet 1   prochlorperazine (COMPAZINE) 10 MG tablet Take 1 tablet (10 mg total) by mouth every 6 (six) hours as needed for nausea or vomiting. (Patient not taking: Reported on 11/30/2022) 30 tablet 1   tamsulosin (FLOMAX) 0.4 MG CAPS capsule Take 1 capsule (0.4 mg total) by mouth daily. (Patient not taking: Reported on 11/18/2022) 30 capsule 1   No current facility-administered medications for this visit.   Facility-Administered Medications Ordered in Other Visits  Medication Dose Route Frequency Provider Last Rate Last Admin   0.9 %  sodium chloride infusion   Intravenous Once [name], MD        Review of Systems  Constitutional:  Positive for fatigue. Negative for appetite change, chills, fever and unexpected weight change.  HENT:   Negative for hearing loss and voice change.   Eyes:  Negative for eye problems and icterus.  Respiratory:  Negative for chest tightness, cough and shortness of breath.   Cardiovascular:  Negative for chest pain and leg swelling.  Gastrointestinal:  Negative for abdominal distention and abdominal pain.       Umbilical discharge  Endocrine: Negative for hot flashes.   Genitourinary:  Negative for difficulty urinating, dysuria and frequency.   Musculoskeletal:  Negative for arthralgias.       Left hip pain  Skin:  Negative for itching and rash.  Neurological:  Negative for light-headedness and numbness.  Hematological:  Negative for adenopathy. Does not bruise/bleed easily.  Psychiatric/Behavioral:  Negative for confusion.      PHYSICAL EXAMINATION: ECOG PERFORMANCE STATUS: 1 - Symptomatic but completely ambulatory  Vitals:   12/09/22 0846  BP: 115/82  Pulse: 81  Temp: (!) 96 F (35.6 C)  SpO2: 94%   Filed Weights   12/09/22 0846  Weight: 122 lb 1.6 oz (55.4 kg)    Physical Exam Constitutional:      Appearance: He is not diaphoretic.  HENT:     Head: Normocephalic and atraumatic.  Eyes:     General: No scleral icterus. Cardiovascular:     Rate and Rhythm: Normal rate and regular rhythm.     Heart sounds: No murmur heard. Pulmonary:     Effort: Pulmonary effort is normal. No respiratory distress.  Abdominal:     General: There is no distension.     Palpations: Abdomen is soft.     Tenderness: There is no abdominal tenderness.     Comments: Umbilical discharge with focal erythematous changes.   Musculoskeletal:        General: Normal range of motion.     Cervical back: Normal range of motion and neck supple.  Skin:    General: Skin is warm and dry.     Findings: No erythema.  Neurological:     Mental Status: He is alert and oriented to person, place, and time. Mental status is at baseline.     Cranial Nerves: No cranial nerve deficit.     Motor: No abnormal muscle tone.  Psychiatric:        Mood and Affect: Affect normal.      LABORATORY DATA:  I have reviewed the data as listed    Latest Ref Rng & Units 12/09/2022    8:33 AM 11/30/2022    8:24 AM 11/18/2022   10:29 AM  CBC  WBC 4.0 - 10.5 K/uL 29.9  7.6  9.8   Hemoglobin 13.0 - 17.0 g/dL 16.1  09.6  04.5   Hematocrit 39.0 - 52.0 % 46.5  47.4  42.7   Platelets 150 -  400 K/uL 131  273  351       Latest Ref Rng & Units 12/09/2022    8:33 AM 11/30/2022    8:25 AM 11/18/2022   10:29 AM  CMP  Glucose 70 - 99 mg/dL 409  811  914   BUN 8 - 23 mg/dL 32  18  19   Creatinine 0.61 - 1.24 mg/dL 7.82  9.56  2.13   Sodium 135 - 145 mmol/L 134  133  128   Potassium 3.5 - 5.1 mmol/L 4.6  4.3  4.5   Chloride 98 - 111 mmol/L 94  95  92   CO2 22 - 32 mmol/L 30  31  28    Calcium 8.9 - 10.3 mg/dL 9.6  9.4  9.7   Total Protein 6.5 - 8.1 g/dL 7.0  7.6  8.2   Total Bilirubin 0.3 - 1.2 mg/dL 0.5  0.6  0.7   Alkaline Phos 38 - 126 U/L 170  121  93   AST 15 - 41 U/L 72  59  62   ALT 0 - 44 U/L 138  86  62      RADIOGRAPHIC STUDIES: I have personally reviewed the radiological images as listed and agreed with the findings in the report. No results found.

## 2022-12-09 NOTE — Assessment & Plan Note (Signed)
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 [employer], high-volume disease. Labs are reviewed and discussed with patient.

## 2022-12-09 NOTE — Assessment & Plan Note (Signed)
Not compliant with medication.   Recommend patient to start on metformin

## 2022-12-14 ENCOUNTER — Inpatient Hospital Stay: Payer: PPO

## 2022-12-14 ENCOUNTER — Inpatient Hospital Stay (HOSPITAL_BASED_OUTPATIENT_CLINIC_OR_DEPARTMENT_OTHER): Payer: PPO | Admitting: Medical Oncology

## 2022-12-14 ENCOUNTER — Other Ambulatory Visit: Payer: Self-pay

## 2022-12-14 VITALS — BP 93/69 | HR 85 | Temp 97.6°F | Resp 20

## 2022-12-14 DIAGNOSIS — R634 Abnormal weight loss: Secondary | ICD-10-CM | POA: Diagnosis not present

## 2022-12-14 DIAGNOSIS — L03316 Cellulitis of umbilicus: Secondary | ICD-10-CM

## 2022-12-14 DIAGNOSIS — E871 Hypo-osmolality and hyponatremia: Secondary | ICD-10-CM | POA: Diagnosis not present

## 2022-12-14 DIAGNOSIS — R7989 Other specified abnormal findings of blood chemistry: Secondary | ICD-10-CM

## 2022-12-14 DIAGNOSIS — R899 Unspecified abnormal finding in specimens from other organs, systems and tissues: Secondary | ICD-10-CM

## 2022-12-14 DIAGNOSIS — C61 Malignant neoplasm of prostate: Secondary | ICD-10-CM

## 2022-12-14 DIAGNOSIS — Z5111 Encounter for antineoplastic chemotherapy: Secondary | ICD-10-CM | POA: Diagnosis not present

## 2022-12-14 LAB — CMP (CANCER CENTER ONLY)
ALT: 180 U/L — ABNORMAL HIGH (ref 0–44)
AST: 89 U/L — ABNORMAL HIGH (ref 15–41)
Albumin: 3.1 g/dL — ABNORMAL LOW (ref 3.5–5.0)
Alkaline Phosphatase: 150 U/L — ABNORMAL HIGH (ref 38–126)
Anion gap: 10 (ref 5–15)
BUN: 29 mg/dL — ABNORMAL HIGH (ref 8–23)
CO2: 25 mmol/L (ref 22–32)
Calcium: 9.1 mg/dL (ref 8.9–10.3)
Chloride: 94 mmol/L — ABNORMAL LOW (ref 98–111)
Creatinine: 0.94 mg/dL (ref 0.61–1.24)
GFR, Estimated: >60 mL/min (ref >60)
Glucose, Bld: 346 mg/dL — ABNORMAL HIGH (ref 70–99)
Potassium: 5.1 mmol/L (ref 3.5–5.1)
Sodium: 129 mmol/L — ABNORMAL LOW (ref 135–145)
Total Bilirubin: 0.6 mg/dL (ref 0.3–1.2)
Total Protein: 6.5 g/dL (ref 6.5–8.1)

## 2022-12-14 LAB — CBC WITH DIFFERENTIAL (CANCER CENTER ONLY)
Abs Immature Granulocytes: 0.19 10*3/uL — ABNORMAL HIGH (ref 0.00–0.07)
Basophils Absolute: 0.1 10*3/uL (ref 0.0–0.1)
Basophils Relative: 1 %
Eosinophils Absolute: 0 10*3/uL (ref 0.0–0.5)
Eosinophils Relative: 0 %
HCT: 47 % (ref 39.0–52.0)
Hemoglobin: 15.8 g/dL (ref 13.0–17.0)
Immature Granulocytes: 1 %
Lymphocytes Relative: 7 %
Lymphs Abs: 1 10*3/uL (ref 0.7–4.0)
MCH: 30.6 pg (ref 26.0–34.0)
MCHC: 33.6 g/dL (ref 30.0–36.0)
MCV: 91.1 fL (ref 80.0–100.0)
Monocytes Absolute: 0.2 10*3/uL (ref 0.1–1.0)
Monocytes Relative: 1 %
Neutro Abs: 13.6 10*3/uL — ABNORMAL HIGH (ref 1.7–7.7)
Neutrophils Relative %: 90 %
Platelet Count: 122 10*3/uL — ABNORMAL LOW (ref 150–400)
RBC: 5.16 MIL/uL (ref 4.22–5.81)
RDW: 13 % (ref 11.5–15.5)
WBC Count: 15.1 10*3/uL — ABNORMAL HIGH (ref 4.0–10.5)
nRBC: 0 % (ref 0.0–0.2)

## 2022-12-14 MED ORDER — SODIUM CHLORIDE 0.9 % IV SOLN
INTRAVENOUS | Status: DC
  Filled 2022-12-14 (×2): qty 250

## 2022-12-14 NOTE — Progress Notes (Signed)
Diarrhea has resolved. Pt denies any nausea/vomiting. Pt has Unsteady gait.  Pt is hyperglycemic today. 346.  Patient is taking his decadron weekly during each infusion including his IV fluids. I educated pt/pt's son to only take these on the days of the chemotherapy. I will highlight the chemotherapy apts on pt's AVS so pt knows which weeks to take.  Pt is not performing glucometeters checks. pt educated to check blood sugars three times a day.

## 2022-12-14 NOTE — Patient Instructions (Addendum)
Joli stated that you can order Boost VHC (530 calories) on [employer].  She has provided a case of 350 calorie ensure vanilla for you.  Please only take the decadron on the weeks of his chemotherapy (not IV Fluid days). The next chemo apts are as follows: 5/22; 6/12; 7/3

## 2022-12-14 NOTE — Progress Notes (Signed)
Hematology/Oncology Consult Note [phone] [phone]     REFERRING PROVIDER: Gracelyn Nurse, MD    CHIEF COMPLAINTS/PURPOSE OF CONSULTATION:  Prostate cancer  Treatment: Docetaxel   HISTORY OF PRESENTING ILLNESS:  Nicolas Gonzales 72 y.o. male presents to establish care for  I have reviewed his chart and materials related to his cancer extensively and collaborated history with the patient. Summary of oncologic history is as follows: Oncology History  Prostate cancer (HCC)  10/11/2022 Initial Diagnosis   Prostate cancer  Patient was noted to have a PSA of 44.  Patient had urology workup and status post prostate biopsy. 10/11/2022, prostate biopsy showed [A] PROSTATE, LEFT BASE:   ACINAR ADENOCARCINOMA, GLEASON 3+4=7  (GG2), INVOLVING 1 OF 1 CORES, MEASURING 14  MM ( 93%). 30% GLEASON PATTERN 4; PERINEURAL INVASION PRESENT  [B] PROSTATE, LEFT MID:   ACINAR ADENOCARCINOMA, GLEASON 3+4=7  (GG 2),INVOLVING 2 OF 2 CORES, MEASURING 26  MM ( 96%). 20% GLEASON PATTERN 4;CRIBRIFORM PATTERN 4 AND PERINEURAL INVASION PRESENT  [C] PROSTATE, LEFT APEX:   ACINAR ADENOCARCINOMA, GLEASON 3+4=7  (GG2), INVOLVING 2 OF 3 CORES, MEASURING 17  MM ( 77%). 20% GLEASON PATTERN 4; CRIBRIFORM PATTERN 4 AND PERINEURAL INVASION PRESENT  [D] PROSTATE, RIGHT BASE:   ACINAR ADENOCARCINOMA, GLEASON 4+4=8  (GG4), INVOLVING 3 OF 3 CORES, MEASURING 15  MM ( 68%). CRIBRIFORM PATTERN 4 AND PERINEURAL INVASION PRESENT  [E] PROSTATE, RIGHT MID:   ACINAR ADENOCARCINOMA, GLEASON 4+3=7  (GG3), INVOLVING 1 OF 3 CORES, MEASURING 12  MM ( 50%). 80% GLEASON PATTERN 4; CRIBRIFORM PATTERN 4 PRESENT  [F] PROSTATE, RIGHT APEX:   ACINAR ADENOCARCINOMA, GLEASON 4+3=7  (GG3), INVOLVING 4 OF 5 CORES, MEASURING 23  MM ( 70%). 80% GLEASON PATTERN 4; TERTIARY PATTERN 5; CRIBRIFORM PATTERN 4 AND PNI PRESEN      11/09/2022 Imaging   PMSA PET scan showed 1. Locally advanced prostate primary with nodal metastasis in  the chest, abdomen, and pelvis. 2. Diffuse, large volume osseous metastasis. 3. Incidental findings, including: Coronary artery atherosclerosis. Aortic Atherosclerosis (ICD10-I70.0). Possible constipation.   11/18/2022 Cancer Staging   Staging form: Prostate, AJCC 8th Edition - Clinical stage from 11/18/2022: Stage IVB (cTX, cM1, PSA: 44, Grade Group: 4) - Signed by [name], MD on 11/18/2022 Stage prefix: Initial diagnosis Prostate specific antigen (PSA) range: 20 or greater Gleason score: 8 Histologic grading system: 5 grade system   11/30/2022 -  Chemotherapy   Patient is on Treatment Plan : PROSTATE Docetaxel (75) q21d      INTERVAL HISTORY Nicolas Gonzales is a 72 y.o. male who has above history reviewed by me today presents for follow up visit for prostate cancer management. He is here with his son today.    He is here today for consideration of IVF for chemotherapy induced diarrhea. We are also following up on umbilical drainage for which he was started on augmentin.They state that he is feeling better in general. Appetite is up a bit, he is having less diarrhea and nausea. He is drinking ensures- trying to find the higher calorie version. He denies any abdominal pain, fevers, yellowing of the skin. We did discover that there was confusion around when he was supposed to take his decadron. He was taking this piror to every appointment (even fluid clinic).   He is followed by Drema Halon in nutrition.   Wt Readings from Last 3 Encounters:  12/09/22 122 lb 1.6 oz (55.4 kg)  11/30/22 131 lb 6.4 oz (59.6  kg)  11/18/22 144 lb (65.3 kg)     MEDICAL HISTORY:  Past Medical History:  Diagnosis Date   BPH (benign prostatic hyperplasia)    Diabetes mellitus without complication (HCC)    Elevated PSA    Hypertension     SURGICAL HISTORY: Past Surgical History:  Procedure Laterality Date   NO PAST SURGERIES     PROSTATE BIOPSY N/A 10/11/2022   Procedure: PROSTATE BIOPSY;  Surgeon: Riki Altes, MD;  Location: [facility] ORS;  Service: Urology;  Laterality: N/A;   TRANSRECTAL ULTRASOUND N/A 10/11/2022   Procedure: TRANSRECTAL ULTRASOUND;  Surgeon: Riki Altes, MD;  Location: [facility] OR[facility];  Service: Urology;  Laterality: N/A;    SOCIAL HISTORY: Social History   Socioeconomic History   Marital status: Married    Spouse name: Not on file   Number of children: Not on file   Years of education: Not on file   Highest education level: Not on file  Occupational History   Not on file  Tobacco Use   Smoking status: Never   Smokeless tobacco: Never  Vaping Use   Vaping Use: Not on file  Substance and Sexual Activity   Alcohol use: Never   Drug use: Never   Sexual activity: Not on file  Other Topics Concern   Not on file  Social History Narrative   Not on file   Social Determinants of Health   Financial Resource Strain: Not on file  Food Insecurity: No Food Insecurity (11/18/2022)   Hunger Vital Sign    Worried About Running Out of Food in the Last Year: Never true    Ran Out of Food in the Last Year: Never true  Transportation Needs: No Transportation Needs (11/18/2022)   PRAPARE - Administrator, Civil Service (Medical): No    Lack of Transportation (Non-Medical): No  Physical Activity: Not on file  Stress: Not on file  Social Connections: Not on file  Intimate Partner Violence: Not At Risk (11/18/2022)   Humiliation, Afraid, Rape, and Kick questionnaire    Fear of Current or Ex-Partner: No    Emotionally Abused: No    Physically Abused: No    Sexually Abused: No    FAMILY HISTORY: Family History  Problem Relation Age of Onset   Breast cancer Mother    Alzheimer's disease Father    Prostate cancer Brother     ALLERGIES:  has No Known Allergies.  MEDICATIONS:  Current Outpatient Medications  Medication Sig Dispense Refill   amoxicillin-clavulanate (AUGMENTIN) 875-125 MG tablet Take 1 tablet by mouth 2 (two) times daily. 14 tablet 0    Cholecalciferol (VITAMIN D-1000 MAX ST) 25 MCG (1000 UT) tablet Take 1,000 Units by mouth daily.     CINNAMON PO Take 1 tablet by mouth daily at 6 (six) AM.     COD LIVER OIL PO Take 1 tablet by mouth daily at 6 (six) AM.     cyanocobalamin (VITAMIN B12) 500 MCG tablet Take 1 tablet by mouth daily.     dexamethasone (DECADRON) 4 MG tablet Take 2 tabs by mouth 2 times daily starting day before chemo. Then take 2 tabs daily for 2 days starting day after chemo. Take with food. 30 tablet 1   Ferrous Sulfate (IRON PO) Take 1 tablet by mouth daily at 6 (six) AM.     FIBER PO Take 1 tablet by mouth daily at 6 (six) AM.     megestrol (MEGACE) 20 MG tablet Take 1  tablet (20 mg total) by mouth 2 (two) times daily. 30 tablet 0   metFORMIN (GLUCOPHAGE) 500 MG tablet Take 500 mg by mouth every morning.     Multiple Vitamins-Minerals (ZINC PO) Take 1 tablet by mouth daily at 6 (six) AM.     mupirocin ointment (BACTROBAN) 2 % Apply 1 Application topically 2 (two) times daily. 30 g 0   oxyCODONE-acetaminophen (PERCOCET/ROXICET) 5-325 MG tablet Take 1 tablet by mouth every 6 (six) hours as needed for moderate pain or severe pain.     prochlorperazine (COMPAZINE) 10 MG tablet Take 1 tablet (10 mg total) by mouth every 6 (six) hours as needed for nausea or vomiting. 30 tablet 1   tamsulosin (FLOMAX) 0.4 MG CAPS capsule Take 1 capsule (0.4 mg total) by mouth daily. 30 capsule 1   losartan (COZAAR) 25 MG tablet Take 1 tablet by mouth every morning. (Patient not taking: Reported on 11/30/2022)     ondansetron (ZOFRAN) 8 MG tablet Take 1 tablet (8 mg total) by mouth every 8 (eight) hours as needed for nausea or vomiting. (Patient not taking: Reported on 11/30/2022) 30 tablet 1   No current facility-administered medications for this visit.    Review of Systems  Constitutional:  Positive for fatigue. Negative for appetite change, chills, fever and unexpected weight change.  HENT:   Negative for hearing loss and voice  change.   Eyes:  Negative for eye problems and icterus.  Respiratory:  Negative for chest tightness, cough and shortness of breath.   Cardiovascular:  Negative for chest pain and leg swelling.  Gastrointestinal:  Negative for abdominal distention and abdominal pain.  Endocrine: Negative for hot flashes.  Genitourinary:  Negative for difficulty urinating, dysuria and frequency.   Musculoskeletal:  Negative for arthralgias.       Left hip pain  Skin:  Negative for itching and rash.  Neurological:  Negative for light-headedness and numbness.  Hematological:  Negative for adenopathy. Does not bruise/bleed easily.  Psychiatric/Behavioral:  Negative for confusion.      PHYSICAL EXAMINATION: ECOG PERFORMANCE STATUS: 1 - Symptomatic but completely ambulatory  Vitals:   12/14/22 1015  BP: 93/69  Pulse: 85  Resp: 20  Temp: 97.6 F (36.4 C)   Physical Exam Constitutional:      Appearance: He is not diaphoretic.  HENT:     Head: Normocephalic and atraumatic.  Eyes:     General: No scleral icterus. Cardiovascular:     Rate and Rhythm: Normal rate and regular rhythm.     Heart sounds: No murmur heard. Pulmonary:     Effort: Pulmonary effort is normal. No respiratory distress.  Abdominal:     General: There is no distension.     Palpations: Abdomen is soft.     Tenderness: There is no abdominal tenderness.     Comments: No umbilical discharge    Musculoskeletal:        General: Normal range of motion.     Cervical back: Normal range of motion and neck supple.  Skin:    General: Skin is warm and dry.     Findings: No erythema.  Neurological:     Mental Status: He is alert and oriented to person, place, and time. Mental status is at baseline.     Cranial Nerves: No cranial nerve deficit.     Motor: No abnormal muscle tone.  Psychiatric:        Mood and Affect: Affect normal.      LABORATORY DATA:  I have reviewed the data as listed    Latest Ref Rng & Units 12/14/2022     9:28 AM 12/09/2022    8:33 AM 11/30/2022    8:24 AM  CBC  WBC 4.0 - 10.5 K/uL 15.1  29.9  7.6   Hemoglobin 13.0 - 17.0 g/dL 16.1  09.6  04.5   Hematocrit 39.0 - 52.0 % 47.0  46.5  47.4   Platelets 150 - 400 K/uL 122  131  273       Latest Ref Rng & Units 12/14/2022    9:28 AM 12/09/2022    8:33 AM 11/30/2022    8:25 AM  CMP  Glucose 70 - 99 mg/dL 409  811  914   BUN 8 - 23 mg/dL 29  32  18   Creatinine 0.61 - 1.24 mg/dL 7.82  9.56  2.13   Sodium 135 - 145 mmol/L 129  134  133   Potassium 3.5 - 5.1 mmol/L 5.1  4.6  4.3   Chloride 98 - 111 mmol/L 94  94  95   CO2 22 - 32 mmol/L 25  30  31    Calcium 8.9 - 10.3 mg/dL 9.1  9.6  9.4   Total Protein 6.5 - 8.1 g/dL 6.5  7.0  7.6   Total Bilirubin 0.3 - 1.2 mg/dL 0.6  0.5  0.6   Alkaline Phos 38 - 126 U/L 150  170  121   AST 15 - 41 U/L 89  72  59   ALT 0 - 44 U/L 180  138  86      RADIOGRAPHIC STUDIES: I have personally reviewed the radiological images as listed and agreed with the findings in the report. No results found.   Assessment and Plan:  Encounter Diagnosis  Name Primary?   Abnormal laboratory test Yes   Diarrhea: Chemotherapy induced. Resolving. Not worsening on Augmentin. IVF today for support. Continue Imodium PRN.   Elevated LFTs: Originally found on labs from 12/09/2022. 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  LFTs are up slightly from this time. Will obtain a HCV nucleic acid amp test per lab. He will avoid tylenol and ETOH (not currently using either). He also has no known history of liver involvement- should labs fail to improve with hydration and time we will obtain CT imaging.   Weight loss: Continues to lose weight. Likely exacerbated by GI loss. Pt given high calorie ensures today and they will continue dietary plan. They have follow up next week.   Hyponatremia: Acute. Likely secondary to GI loss. IVF today.   Cellulitis/umbilical: Improving with  Augmentin. Continue treatment course.   Disposition: Additional lab order placed  1L IVF Has follow up on 12/21/2022 with Dr. Lavena Bullion [facility]  Brand Males [place] PA-C

## 2022-12-16 ENCOUNTER — Encounter: Payer: Self-pay | Admitting: Oncology

## 2022-12-16 LAB — HCV RNA QUANT RFLX ULTRA OR GENOTYP
HCV RNA Qnt(log copy/mL): 5.897 log10 IU/mL
HepC Qn: 788000 IU/mL

## 2022-12-16 LAB — HEPATITIS C GENOTYPE

## 2022-12-20 MED FILL — Dexamethasone Sodium Phosphate Inj 100 MG/10ML: INTRAMUSCULAR | Qty: 1 | Status: AC

## 2022-12-21 ENCOUNTER — Encounter: Payer: Self-pay | Admitting: Oncology

## 2022-12-21 ENCOUNTER — Telehealth: Payer: Self-pay

## 2022-12-21 ENCOUNTER — Inpatient Hospital Stay: Payer: PPO

## 2022-12-21 ENCOUNTER — Other Ambulatory Visit (HOSPITAL_COMMUNITY): Payer: Self-pay

## 2022-12-21 ENCOUNTER — Inpatient Hospital Stay (HOSPITAL_BASED_OUTPATIENT_CLINIC_OR_DEPARTMENT_OTHER): Payer: PPO | Admitting: Oncology

## 2022-12-21 ENCOUNTER — Telehealth: Payer: Self-pay | Admitting: Pharmacist

## 2022-12-21 VITALS — BP 112/78 | HR 104 | Temp 97.3°F | Resp 18 | Wt 130.5 lb

## 2022-12-21 DIAGNOSIS — C61 Malignant neoplasm of prostate: Secondary | ICD-10-CM

## 2022-12-21 DIAGNOSIS — R7989 Other specified abnormal findings of blood chemistry: Secondary | ICD-10-CM

## 2022-12-21 DIAGNOSIS — G893 Neoplasm related pain (acute) (chronic): Secondary | ICD-10-CM

## 2022-12-21 DIAGNOSIS — R634 Abnormal weight loss: Secondary | ICD-10-CM

## 2022-12-21 DIAGNOSIS — R7401 Elevation of levels of liver transaminase levels: Secondary | ICD-10-CM

## 2022-12-21 DIAGNOSIS — B1920 Unspecified viral hepatitis C without hepatic coma: Secondary | ICD-10-CM | POA: Diagnosis not present

## 2022-12-21 DIAGNOSIS — Z5111 Encounter for antineoplastic chemotherapy: Secondary | ICD-10-CM | POA: Diagnosis not present

## 2022-12-21 DIAGNOSIS — E1165 Type 2 diabetes mellitus with hyperglycemia: Secondary | ICD-10-CM

## 2022-12-21 DIAGNOSIS — L03316 Cellulitis of umbilicus: Secondary | ICD-10-CM

## 2022-12-21 DIAGNOSIS — E871 Hypo-osmolality and hyponatremia: Secondary | ICD-10-CM

## 2022-12-21 DIAGNOSIS — Z79818 Long term (current) use of other agents affecting estrogen receptors and estrogen levels: Secondary | ICD-10-CM

## 2022-12-21 LAB — CBC WITH DIFFERENTIAL (CANCER CENTER ONLY)
Abs Immature Granulocytes: 0.09 10*3/uL — ABNORMAL HIGH (ref 0.00–0.07)
Basophils Absolute: 0 10*3/uL (ref 0.0–0.1)
Basophils Relative: 0 %
Eosinophils Absolute: 0 10*3/uL (ref 0.0–0.5)
Eosinophils Relative: 0 %
HCT: 40.5 % (ref 39.0–52.0)
Hemoglobin: 13.6 g/dL (ref 13.0–17.0)
Immature Granulocytes: 1 %
Lymphocytes Relative: 11 %
Lymphs Abs: 1.3 10*3/uL (ref 0.7–4.0)
MCH: 30.7 pg (ref 26.0–34.0)
MCHC: 33.6 g/dL (ref 30.0–36.0)
MCV: 91.4 fL (ref 80.0–100.0)
Monocytes Absolute: 0.4 10*3/uL (ref 0.1–1.0)
Monocytes Relative: 4 %
Neutro Abs: 10.3 10*3/uL — ABNORMAL HIGH (ref 1.7–7.7)
Neutrophils Relative %: 84 %
Platelet Count: 221 10*3/uL (ref 150–400)
RBC: 4.43 MIL/uL (ref 4.22–5.81)
RDW: 13.6 % (ref 11.5–15.5)
WBC Count: 12.1 10*3/uL — ABNORMAL HIGH (ref 4.0–10.5)
nRBC: 0 % (ref 0.0–0.2)

## 2022-12-21 LAB — CMP (CANCER CENTER ONLY)
ALT: 157 U/L — ABNORMAL HIGH (ref 0–44)
AST: 67 U/L — ABNORMAL HIGH (ref 15–41)
Albumin: 2.8 g/dL — ABNORMAL LOW (ref 3.5–5.0)
Alkaline Phosphatase: 133 U/L — ABNORMAL HIGH (ref 38–126)
Anion gap: 7 (ref 5–15)
BUN: 20 mg/dL (ref 8–23)
CO2: 26 mmol/L (ref 22–32)
Calcium: 8.9 mg/dL (ref 8.9–10.3)
Chloride: 99 mmol/L (ref 98–111)
Creatinine: 0.68 mg/dL (ref 0.61–1.24)
GFR, Estimated: >60 mL/min (ref >60)
Glucose, Bld: 341 mg/dL — ABNORMAL HIGH (ref 70–99)
Potassium: 4.2 mmol/L (ref 3.5–5.1)
Sodium: 132 mmol/L — ABNORMAL LOW (ref 135–145)
Total Bilirubin: 0.9 mg/dL (ref 0.3–1.2)
Total Protein: 5.9 g/dL — ABNORMAL LOW (ref 6.5–8.1)

## 2022-12-21 LAB — PSA: Prostatic Specific Antigen: 34.38 ng/mL — ABNORMAL HIGH (ref 0.00–4.00)

## 2022-12-21 MED ORDER — DEGARELIX ACETATE 80 MG ~~LOC~~ SOLR
80.0000 mg | Freq: Once | SUBCUTANEOUS | Status: AC
  Administered 2022-12-21: 80 mg via SUBCUTANEOUS
  Filled 2022-12-21: qty 4

## 2022-12-21 NOTE — Assessment & Plan Note (Signed)
Improved. S/p Augmentin 875/125mg  BID x 7 days. Topical Bactroban

## 2022-12-21 NOTE — Telephone Encounter (Signed)
Oral Oncology Pharmacist Encounter  Received new prescription for Xtandi (enzalutamide) for the treatment of metastatic castration sensitive prostate cancer in conjunction with ADT, planned duration until disease progression or unacceptable drug toxicity. Patient has issues tolerating docetaxel.   Prescription dose and frequency assessed.   Current medication list in Epic reviewed, no relevant DDIs with enzalutamide identified.   Evaluated chart and no patient barriers to medication adherence identified.   Prescription has been e-scribed to the [facility] for benefits analysis and approval.  Oral Oncology Clinic will continue to follow for insurance authorization, copayment issues, initial counseling and start date.   Remi Haggard, PharmD, BCPS, BCOP, CPP Hematology/Oncology Clinical Pharmacist Practitioner /DB/AP Oral Chemotherapy Navigation Clinic [phone]  12/21/2022 4:51 PM

## 2022-12-21 NOTE — Assessment & Plan Note (Signed)
Unknown etiology. chronic issue for him. Sodium level has improved.   

## 2022-12-21 NOTE — Assessment & Plan Note (Signed)
Follow up with dietitian

## 2022-12-21 NOTE — Assessment & Plan Note (Signed)
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 [employer], high-volume disease. Labs are reviewed and discussed with patient. 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  I would not give any additional docetaxel at this point. I will switch to [facility].  Will check insurance coverage.

## 2022-12-21 NOTE — Assessment & Plan Note (Signed)
Percocet 5/325 5 mg every 6 hours as needed for pain

## 2022-12-21 NOTE — Assessment & Plan Note (Signed)
Firmagon today.  Plan to switch to Eligard in 4 weeks.

## 2022-12-21 NOTE — Progress Notes (Signed)
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 [employer], high-volume disease. Labs are reviewed and discussed with patient. 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  I would not give any additional docetaxel at this point. I will switch to ARPI.  Will check insurance coverage.     Hyponatremia Unknown etiology. chronic issue for him. Sodium level has improved.    Transaminitis Due to hepatitis C and recent chemotherapy. Check right upper quadrant ultrasound  Neoplasm related pain Percocet 5/325 5 mg every 6 hours as needed for pain   Cellulitis, umbilical Improved. S/p Augmentin 875/125mg  BID x 7 days. Topical Bactroban   Weight loss Follow up with dietitian   Uncontrolled diabetes mellitus with hyperglycemia (HCC) Not compliant with medication.   Recommend patient to take metformin and follow up with PCP  HCV (hepatitis C virus) Refer to GI, discussed with Dr. Norma Fredrickson.   Androgen deprivation therapy Deborra Medina today.  Plan to switch to Eligard in 4 weeks.    Orders Placed This Encounter  Procedures   US Abdomen Limited RUQ (LIVER/GB)    Standing Status:   Future    Standing Expiration Date:   12/21/2023    Order Specific Question:   Reason for Exam (SYMPTOM  OR DIAGNOSIS REQUIRED)    Answer:   Hep C    Order Specific Question:   Preferred imaging location?    Answer:   [facility]   Hepatic function panel    Standing Status:   Future    Standing Expiration Date:    12/21/2023   CBC with Differential (Cancer Center Only)    Standing Status:   Future    Standing Expiration Date:   12/21/2023   CMP (Cancer Center only)    Standing Status:   Future    Standing Expiration Date:   12/21/2023   PSA    Standing Status:   Future    Standing Expiration Date:   12/21/2023   Ambulatory referral to Gastroenterology    Referral Priority:   Routine    Referral Type:   Consultation    Referral Reason:   Specialty Services Required    Referred to Provider:   Stanton Kidney, MD    Number of Visits Requested:   1   Follow-up in   2 weeks Lab- LFT 4 weeks lab MD + Eligard We spent sufficient time to discuss many aspect of care, questions were answered to patient's satisfaction.  [name], MD, PhD [facility] Hematology Oncology 12/21/2022    HISTORY OF PRESENTING ILLNESS:  Nicolas Gonzales 72 y.o. male presents to establish care for  I have reviewed his chart and materials related to his cancer extensively and collaborated history with the patient. Summary of oncologic history is as follows: Oncology History  Prostate cancer (HCC)  10/11/2022 Initial Diagnosis   Prostate cancer  Patient was noted to have a PSA of 44.  Patient had urology workup and status post prostate biopsy. 10/11/2022, prostate biopsy showed [A] PROSTATE, LEFT BASE:   ACINAR ADENOCARCINOMA, GLEASON  3+4=7  (GG2), INVOLVING 1 OF 1 CORES, MEASURING 14  MM ( 93%). 30% GLEASON PATTERN 4; PERINEURAL INVASION PRESENT  [B] PROSTATE, LEFT MID:   ACINAR ADENOCARCINOMA, GLEASON 3+4=7  (GG 2),INVOLVING 2 OF 2 CORES, MEASURING 26  MM ( 96%). 20% GLEASON PATTERN 4;CRIBRIFORM PATTERN 4 AND PERINEURAL INVASION PRESENT  [C] PROSTATE, LEFT APEX:   ACINAR ADENOCARCINOMA, GLEASON 3+4=7  (GG2), INVOLVING 2 OF 3 CORES, MEASURING 17  MM ( 77%). 20% GLEASON PATTERN 4; CRIBRIFORM PATTERN 4 AND PERINEURAL INVASION PRESENT  [D] PROSTATE, RIGHT BASE:   ACINAR ADENOCARCINOMA, GLEASON 4+4=8  (GG4), INVOLVING 3 OF 3 CORES,  MEASURING 15  MM ( 68%). CRIBRIFORM PATTERN 4 AND PERINEURAL INVASION PRESENT  [E] PROSTATE, RIGHT MID:   ACINAR ADENOCARCINOMA, GLEASON 4+3=7  (GG3), INVOLVING 1 OF 3 CORES, MEASURING 12  MM ( 50%). 80% GLEASON PATTERN 4; CRIBRIFORM PATTERN 4 PRESENT  [F] PROSTATE, RIGHT APEX:   ACINAR ADENOCARCINOMA, GLEASON 4+3=7  (GG3), INVOLVING 4 OF 5 CORES, MEASURING 23  MM ( 70%). 80% GLEASON PATTERN 4; TERTIARY PATTERN 5; CRIBRIFORM PATTERN 4 AND PNI PRESEN      11/09/2022 Imaging   PMSA PET scan showed 1. Locally advanced prostate primary with nodal metastasis in the chest, abdomen, and pelvis. 2. Diffuse, large volume osseous metastasis. 3. Incidental findings, including: Coronary artery atherosclerosis. Aortic Atherosclerosis (ICD10-I70.0). Possible constipation.   11/18/2022 Cancer Staging   Staging form: Prostate, AJCC 8th Edition - Clinical stage from 11/18/2022: Stage IVB (cTX, cM1, PSA: 44, Grade Group: 4) - Signed by [name], MD on 11/18/2022 Stage prefix: Initial diagnosis Prostate specific antigen (PSA) range: 20 or greater Gleason score: 8 Histologic grading system: 5 grade system   11/30/2022 -  Chemotherapy   Patient is on Treatment Plan : PROSTATE Docetaxel (75) q21d      Patient reports left hip pain radiating down to left lower extremity. His primary care provider prescribed oxycodone/acetaminophen 5/325 mg every 6 hours as needed.  He has not tried due to concern of side effects. Family history positive for brother with prostate cancer.  Today he was accompanied by 2 of his sons. Patient is active for his age.  He goes to gym 1-2 times per week.   INTERVAL HISTORY Nicolas Gonzales is a 72 y.o. male who has above history reviewed by me today presents for follow up visit for prostate cancer management.  He tolerated chemotherapy. Mild nausea and diarrhea for 2 hours and spontaneously resolved.  Bellybutton discharge has improved. Appetite is not good, weight remains  stable. Denies any alcohol use.  MEDICAL HISTORY:  Past Medical History:  Diagnosis Date   BPH (benign prostatic hyperplasia)    Diabetes mellitus without complication (HCC)    Elevated PSA    Hypertension     SURGICAL HISTORY: Past Surgical History:  Procedure Laterality Date   NO PAST SURGERIES     PROSTATE BIOPSY N/A 10/11/2022   Procedure: PROSTATE BIOPSY;  Surgeon: Riki Altes, MD;  Location: [facility] ORS;  Service: Urology;  Laterality: N/A;   TRANSRECTAL ULTRASOUND N/A 10/11/2022   Procedure: TRANSRECTAL ULTRASOUND;  Surgeon: Riki Altes, MD;  Location: [facility] ORS;  Service: Urology;  Laterality: N/A;    SOCIAL HISTORY: Social History   Socioeconomic History   Marital status: Married    Spouse name: Not on file   Number of children: Not on file   Years of education: Not on file   Highest education level: Not on file  Occupational  History   Not on file  Tobacco Use   Smoking status: Never   Smokeless tobacco: Never  Vaping Use   Vaping Use: Not on file  Substance and Sexual Activity   Alcohol use: Never   Drug use: Never   Sexual activity: Not on file  Other Topics Concern   Not on file  Social History Narrative   Not on file   Social Determinants of Health   Financial Resource Strain: Not on file  Food Insecurity: No Food Insecurity (11/18/2022)   Hunger Vital Sign    Worried About Running Out of Food in the Last Year: Never true    Ran Out of Food in the Last Year: Never true  Transportation Needs: No Transportation Needs (11/18/2022)   PRAPARE - Administrator, Civil Service (Medical): No    Lack of Transportation (Non-Medical): No  Physical Activity: Not on file  Stress: Not on file  Social Connections: Not on file  Intimate Partner Violence: Not At Risk (11/18/2022)   Humiliation, Afraid, Rape, and Kick questionnaire    Fear of Current or Ex-Partner: No    Emotionally Abused: No    Physically Abused: No    Sexually Abused: No     FAMILY HISTORY: Family History  Problem Relation Age of Onset   Breast cancer Mother    Alzheimer's disease Father    Prostate cancer Brother     ALLERGIES:  has No Known Allergies.  MEDICATIONS:  Current Outpatient Medications  Medication Sig Dispense Refill   Cholecalciferol (VITAMIN D-1000 MAX ST) 25 MCG (1000 UT) tablet Take 1,000 Units by mouth daily.     CINNAMON PO Take 1 tablet by mouth daily at 6 (six) AM.     COD LIVER OIL PO Take 1 tablet by mouth daily at 6 (six) AM.     cyanocobalamin (VITAMIN B12) 500 MCG tablet Take 1 tablet by mouth daily.     Ferrous Sulfate (IRON PO) Take 1 tablet by mouth daily at 6 (six) AM.     FIBER PO Take 1 tablet by mouth daily at 6 (six) AM.     megestrol (MEGACE) 20 MG tablet Take 1 tablet (20 mg total) by mouth 2 (two) times daily. 30 tablet 0   metFORMIN (GLUCOPHAGE) 500 MG tablet Take 500 mg by mouth every morning.     Multiple Vitamins-Minerals (ZINC PO) Take 1 tablet by mouth daily at 6 (six) AM.     mupirocin ointment (BACTROBAN) 2 % Apply 1 Application topically 2 (two) times daily. 30 g 0   oxyCODONE-acetaminophen (PERCOCET/ROXICET) 5-325 MG tablet Take 1 tablet by mouth every 6 (six) hours as needed for moderate pain or severe pain.     tamsulosin (FLOMAX) 0.4 MG CAPS capsule Take 1 capsule (0.4 mg total) by mouth daily. 30 capsule 1   losartan (COZAAR) 25 MG tablet Take 1 tablet by mouth every morning. (Patient not taking: Reported on 11/30/2022)     ondansetron (ZOFRAN) 8 MG tablet Take 1 tablet (8 mg total) by mouth every 8 (eight) hours as needed for nausea or vomiting. (Patient not taking: Reported on 11/30/2022) 30 tablet 1   prochlorperazine (COMPAZINE) 10 MG tablet Take 1 tablet (10 mg total) by mouth every 6 (six) hours as needed for nausea or vomiting. (Patient not taking: Reported on 12/21/2022) 30 tablet 1   No current facility-administered medications for this visit.    Review of Systems  Constitutional:  Positive for  fatigue. Negative  for appetite change, chills, fever and unexpected weight change.  HENT:   Negative for hearing loss and voice change.   Eyes:  Negative for eye problems and icterus.  Respiratory:  Negative for chest tightness, cough and shortness of breath.   Cardiovascular:  Negative for chest pain and leg swelling.  Gastrointestinal:  Negative for abdominal distention and abdominal pain.       Umbilical discharge  Endocrine: Negative for hot flashes.  Genitourinary:  Negative for difficulty urinating, dysuria and frequency.   Musculoskeletal:  Negative for arthralgias.       Left hip pain  Skin:  Negative for itching and rash.  Neurological:  Negative for light-headedness and numbness.  Hematological:  Negative for adenopathy. Does not bruise/bleed easily.  Psychiatric/Behavioral:  Negative for confusion.      PHYSICAL EXAMINATION: ECOG PERFORMANCE STATUS: 1 - Symptomatic but completely ambulatory  Vitals:   12/21/22 0913  BP: 112/78  Pulse: (!) 104  Resp: 18  Temp: (!) 97.3 F (36.3 C)   Filed Weights   12/21/22 0913  Weight: 130 lb 8 oz (59.2 kg)    Physical Exam Constitutional:      Appearance: He is not diaphoretic.  HENT:     Head: Normocephalic and atraumatic.  Eyes:     General: No scleral icterus. Cardiovascular:     Rate and Rhythm: Normal rate and regular rhythm.     Heart sounds: No murmur heard. Pulmonary:     Effort: Pulmonary effort is normal. No respiratory distress.  Abdominal:     General: There is no distension.     Palpations: Abdomen is soft.     Tenderness: There is no abdominal tenderness.     Comments: Umbilical discharge with focal erythematous changes.   Musculoskeletal:        General: Normal range of motion.     Cervical back: Normal range of motion and neck supple.  Skin:    General: Skin is warm and dry.     Findings: No erythema.  Neurological:     Mental Status: He is alert and oriented to person, place, and time. Mental  status is at baseline.     Cranial Nerves: No cranial nerve deficit.     Motor: No abnormal muscle tone.  Psychiatric:        Mood and Affect: Affect normal.      LABORATORY DATA:  I have reviewed the data as listed    Latest Ref Rng & Units 12/21/2022    8:46 AM 12/14/2022    9:28 AM 12/09/2022    8:33 AM  CBC  WBC 4.0 - 10.5 K/uL 12.1  15.1  29.9   Hemoglobin 13.0 - 17.0 g/dL 16.1  09.6  04.5   Hematocrit 39.0 - 52.0 % 40.5  47.0  46.5   Platelets 150 - 400 K/uL 221  122  131       Latest Ref Rng & Units 12/21/2022    8:46 AM 12/14/2022    9:28 AM 12/09/2022    8:33 AM  CMP  Glucose 70 - 99 mg/dL 409  811  914   BUN 8 - 23 mg/dL 20  29  32   Creatinine 0.61 - 1.24 mg/dL 7.82  9.56  2.13   Sodium 135 - 145 mmol/L 132  129  134   Potassium 3.5 - 5.1 mmol/L 4.2  5.1  4.6   Chloride 98 - 111 mmol/L 99  94  94   CO2 22 -  32 mmol/L 26  25  30    Calcium 8.9 - 10.3 mg/dL 8.9  9.1  9.6   Total Protein 6.5 - 8.1 g/dL 5.9  6.5  7.0   Total Bilirubin 0.3 - 1.2 mg/dL 0.9  0.6  0.5   Alkaline Phos 38 - 126 U/L 133  150  170   AST 15 - 41 U/L 67  89  72   ALT 0 - 44 U/L 157  180  138      RADIOGRAPHIC STUDIES: I have personally reviewed the radiological images as listed and agreed with the findings in the report. No results found.

## 2022-12-21 NOTE — Assessment & Plan Note (Signed)
Refer to GI, discussed with Dr. Norma Fredrickson.

## 2022-12-21 NOTE — Telephone Encounter (Addendum)
Oral Oncology Patient Advocate Encounter  New authorization   Received notification that prior authorization for Nicolas Gonzales is required.   PA submitted on 12/21/22 via e-fax to [employer] at [phone]  Case ID: [id number]  Status is pending     Ardeen Fillers, CPhT Oncology Pharmacy Patient Advocate  [facility]  [phone] (phone) [phone] (fax) 12/21/2022 1:24 PM

## 2022-12-21 NOTE — Assessment & Plan Note (Signed)
Not compliant with medication.   Recommend patient to take metformin and follow up with PCP

## 2022-12-21 NOTE — Progress Notes (Signed)
Nutrition Follow-up:   Patient with stage IV prostate cancer.  Patient receiving taxotere.  Held today.    Met with patient and son after MD visit.  Patient reports that he feels like his appetite has picked back up some.  Taking megace.  Has been drinking 3 of 350 calorie shakes daily.  Last night able to eat chicken pot pie (handful), small corn on cob, 3-4 bites of broccoli and big bowl of peach cobbler.  Yesterday for lunch ate 10-15 bites of cube steak, roll and gingerale.  Drank a ensure this am.      Medications: reviewed  Labs: glucose 341 (dexmethasone yesterday)  Anthropometrics:   Weight 130 lb 8 oz   122 lb on 5/10 131 lb 6.4 oz on 5/1  UBW of 170-175 lb   NUTRITION DIAGNOSIS: Inadequate oral intake continues   MALNUTRITION DIAGNOSIS: severe malnutrition continues   INTERVENTION:  Continue appetite stimulant Continue high calorie shake, 350 calories or higher due to severe malnutrition.  Aware these are higher in sugar.  Encouraged adjusting medication to control blood glucose not restricting diet Reviewed ways to add calories and protein to diet    MONITORING, EVALUATION, GOAL: weight trends, intake   NEXT VISIT: Wednesday, June 19 following injection  Joesph Marcy B. Freida Busman, RD, LDN Registered Dietitian [phone]

## 2022-12-21 NOTE — Assessment & Plan Note (Signed)
Due to hepatitis C and recent chemotherapy. Check right upper quadrant ultrasound

## 2022-12-22 ENCOUNTER — Telehealth: Payer: Self-pay

## 2022-12-22 ENCOUNTER — Other Ambulatory Visit (HOSPITAL_COMMUNITY): Payer: Self-pay

## 2022-12-22 ENCOUNTER — Ambulatory Visit
Admission: RE | Admit: 2022-12-22 | Discharge: 2022-12-22 | Disposition: A | Discharge status: Home / Self Care | Payer: PPO | Source: Ambulatory Visit | Attending: Oncology | Admitting: Oncology

## 2022-12-22 DIAGNOSIS — R7401 Elevation of levels of liver transaminase levels: Secondary | ICD-10-CM | POA: Insufficient documentation

## 2022-12-22 DIAGNOSIS — B1920 Unspecified viral hepatitis C without hepatic coma: Secondary | ICD-10-CM | POA: Diagnosis present

## 2022-12-22 DIAGNOSIS — R7989 Other specified abnormal findings of blood chemistry: Secondary | ICD-10-CM | POA: Diagnosis present

## 2022-12-22 MED ORDER — ENZALUTAMIDE 40 MG PO TABS
160.0000 mg | ORAL_TABLET | Freq: Every day | ORAL | 0 refills | Status: DC
Start: 2022-12-22 — End: 2023-01-19

## 2022-12-22 NOTE — Telephone Encounter (Signed)
Received call from [employer] requesting co-pay information since patient's insurance would not provide that to a 3rd party. Co-pay and prior authorization information provided to [facility] Representative and I was informed they could now continue to process application. I will continue to follow and update until final determination.    Ardeen Fillers, CPhT Oncology Pharmacy Patient Advocate  [facility]  [phone] (phone) [phone] (fax) 12/22/2022 2:44 PM

## 2022-12-22 NOTE — Telephone Encounter (Signed)
Oral Oncology Patient Advocate Encounter   Began application for assistance for Xtandi through [employer].   Application will be submitted upon completion of necessary supporting documentation.   [employer]' phone number [phone].   I will continue to check the status until final determination.    Ardeen Fillers, CPhT Oncology Pharmacy Patient Advocate  [facility]  [phone] (phone) [phone] (fax) 12/22/2022 11:26 AM

## 2022-12-22 NOTE — Telephone Encounter (Signed)
Oral Oncology Patient Advocate Encounter   Submitted application for assistance for Xtandi to [employer].   Application submitted via [employer]' phone number [phone].   I will continue to check the status until final determination.    Ardeen Fillers, CPhT Oncology Pharmacy Patient Advocate  [facility]  [phone] (phone) [phone] (fax) 12/22/2022 11:52 AM

## 2022-12-22 NOTE — Telephone Encounter (Addendum)
Oral Oncology Patient Advocate Encounter  Prior Authorization for Diana Eves has been approved.    PA# [id number]  Effective dates: 12/22/22 through 12/22/23  Patients co-pay is $3,306.00.    Ardeen Fillers, CPhT Oncology Pharmacy Patient Advocate  [facility]  [phone] (phone) [phone] (fax) 12/22/2022 10:47 AM

## 2022-12-23 ENCOUNTER — Inpatient Hospital Stay: Payer: PPO

## 2022-12-23 ENCOUNTER — Other Ambulatory Visit: Payer: Self-pay | Admitting: Medical Oncology

## 2022-12-23 DIAGNOSIS — B1920 Unspecified viral hepatitis C without hepatic coma: Secondary | ICD-10-CM

## 2022-12-23 NOTE — Telephone Encounter (Signed)
Received notification from [employer] that an Asset Screening Questionnaire needed to be completed to determine whether or not patient qualified for [employer]. I will contact the patient to complete form and fax back to [employer] to continue processing application. I will continue to follow and update until final determination.   1st call - 05/24 LVM 2nd call - 05/28 LVM  Apolinar Junes, patient's son, returned my call and will have Mr. Ayushman Szostak give me a call to complete Asset Screening Questionnaire. I will submit once completed.  Ardeen Fillers, CPhT Oncology Pharmacy Patient Advocate  [facility]  [phone] (phone) [phone] (fax) 12/23/2022 10:11 AM

## 2022-12-27 NOTE — Telephone Encounter (Signed)
Patient returned my call and completed Asset Screening Questionnaire. Questionnaire has been uploaded to [employer] via their web portal. I will continue to follow and update until final determination.    Ardeen Fillers, CPhT Oncology Pharmacy Patient Advocate  [facility]  [phone] (phone) [phone] (fax) 12/27/2022 11:25 AM

## 2022-12-28 ENCOUNTER — Encounter: Payer: Self-pay | Admitting: Oncology

## 2022-12-28 ENCOUNTER — Telehealth: Payer: Self-pay

## 2022-12-28 NOTE — Telephone Encounter (Signed)
Duplicate referral cancelled. Called [facility] to follow up on referral. They have tried to contact pt but no answer. I will send him a mychart message so he can call GI and set up appt.

## 2022-12-28 NOTE — Telephone Encounter (Signed)
-----   Message from [name], MD sent at 12/27/2022  8:29 PM EDT ----- I have already placed the referral. Please check if Maralyn Sago has placed another one. She is on pal  If yes,please cancel to avoid confusion.   ----- Message ----- From: Karen Kitchens Sent: 12/23/2022   3:37 PM EDT To: [name], MD  Pt aware. Referral to Hepatology placed.

## 2022-12-28 NOTE — Telephone Encounter (Addendum)
Oral Oncology Patient Advocate Encounter   Received notification that the application for assistance for Xtandi through [employer] has been approved.   [employer]' phone number [phone].   Effective dates: 12/28/22 through 08/01/23  Patient knows to expect a call from [facility] to set up initial shipment of medication. Patient also knows to call me at [phone] with any questions or concerns regarding receiving medication from [facility].    Ardeen Fillers, CPhT Oncology Pharmacy Patient Advocate  [facility]  [phone] (phone) [phone] (fax) 12/28/2022 3:28 PM

## 2022-12-29 NOTE — Telephone Encounter (Signed)
Left message on [name]'s phone for them to return call to [facility] to schedule appt

## 2023-01-04 ENCOUNTER — Inpatient Hospital Stay: Payer: PPO | Admitting: Pharmacist

## 2023-01-04 ENCOUNTER — Inpatient Hospital Stay: Payer: PPO | Attending: Oncology

## 2023-01-04 DIAGNOSIS — Z79899 Other long term (current) drug therapy: Secondary | ICD-10-CM | POA: Diagnosis not present

## 2023-01-04 DIAGNOSIS — B1920 Unspecified viral hepatitis C without hepatic coma: Secondary | ICD-10-CM

## 2023-01-04 DIAGNOSIS — Z5111 Encounter for antineoplastic chemotherapy: Secondary | ICD-10-CM | POA: Diagnosis present

## 2023-01-04 DIAGNOSIS — C61 Malignant neoplasm of prostate: Secondary | ICD-10-CM | POA: Insufficient documentation

## 2023-01-04 DIAGNOSIS — C7951 Secondary malignant neoplasm of bone: Secondary | ICD-10-CM | POA: Diagnosis not present

## 2023-01-04 LAB — HEPATIC FUNCTION PANEL
ALT: 109 U/L — ABNORMAL HIGH (ref 0–44)
AST: 84 U/L — ABNORMAL HIGH (ref 15–41)
Albumin: 2.7 g/dL — ABNORMAL LOW (ref 3.5–5.0)
Alkaline Phosphatase: 135 U/L — ABNORMAL HIGH (ref 38–126)
Bilirubin, Direct: 0.3 mg/dL — ABNORMAL HIGH (ref 0.0–0.2)
Indirect Bilirubin: 0.5 mg/dL (ref 0.3–0.9)
Total Bilirubin: 0.8 mg/dL (ref 0.3–1.2)
Total Protein: 6.2 g/dL — ABNORMAL LOW (ref 6.5–8.1)

## 2023-01-04 NOTE — Progress Notes (Signed)
Oral Chemotherapy Clinic [facility]  [phone] [phone]  Patient Care Team: Gracelyn Nurse, MD as PCP - General (Internal Medicine)   Name of the patient: Nicolas Gonzales  [id number]  April 07, 1951   Date of visit: 01/04/23  HPI: Patient is a 72 y.o. male with metastatic castration sensitive prostate cancer. Patient has issues tolerating docetaxel and is now bing transitioned to treatment with enzalutamide.   Reason for Consult: Enzalutamide oral chemotherapy education.   PAST MEDICAL HISTORY: Past Medical History:  Diagnosis Date   BPH (benign prostatic hyperplasia)    Diabetes mellitus without complication (HCC)    Elevated PSA    Hypertension     HEMATOLOGY/ONCOLOGY HISTORY:  Oncology History  Prostate cancer (HCC)  10/11/2022 Initial Diagnosis   Prostate cancer  Patient was noted to have a PSA of 44.  Patient had urology workup and status post prostate biopsy. 10/11/2022, prostate biopsy showed [A] PROSTATE, LEFT BASE:   ACINAR ADENOCARCINOMA, GLEASON 3+4=7  (GG2), INVOLVING 1 OF 1 CORES, MEASURING 14  MM ( 93%). 30% GLEASON PATTERN 4; PERINEURAL INVASION PRESENT  [B] PROSTATE, LEFT MID:   ACINAR ADENOCARCINOMA, GLEASON 3+4=7  (GG 2),INVOLVING 2 OF 2 CORES, MEASURING 26  MM ( 96%). 20% GLEASON PATTERN 4;CRIBRIFORM PATTERN 4 AND PERINEURAL INVASION PRESENT  [C] PROSTATE, LEFT APEX:   ACINAR ADENOCARCINOMA, GLEASON 3+4=7  (GG2), INVOLVING 2 OF 3 CORES, MEASURING 17  MM ( 77%). 20% GLEASON PATTERN 4; CRIBRIFORM PATTERN 4 AND PERINEURAL INVASION PRESENT  [D] PROSTATE, RIGHT BASE:   ACINAR ADENOCARCINOMA, GLEASON 4+4=8  (GG4), INVOLVING 3 OF 3 CORES, MEASURING 15  MM ( 68%). CRIBRIFORM PATTERN 4 AND PERINEURAL INVASION PRESENT  [E] PROSTATE, RIGHT MID:   ACINAR ADENOCARCINOMA, GLEASON 4+3=7  (GG3), INVOLVING 1 OF 3 CORES, MEASURING 12  MM ( 50%). 80% GLEASON PATTERN 4; CRIBRIFORM PATTERN 4 PRESENT  [F] PROSTATE, RIGHT APEX:   ACINAR  ADENOCARCINOMA, GLEASON 4+3=7  (GG3), INVOLVING 4 OF 5 CORES, MEASURING 23  MM ( 70%). 80% GLEASON PATTERN 4; TERTIARY PATTERN 5; CRIBRIFORM PATTERN 4 AND PNI PRESEN      11/09/2022 Imaging   PMSA PET scan showed 1. Locally advanced prostate primary with nodal metastasis in the chest, abdomen, and pelvis. 2. Diffuse, large volume osseous metastasis. 3. Incidental findings, including: Coronary artery atherosclerosis. Aortic Atherosclerosis (ICD10-I70.0). Possible constipation.   11/18/2022 Cancer Staging   Staging form: Prostate, AJCC 8th Edition - Clinical stage from 11/18/2022: Stage IVB (cTX, cM1, PSA: 44, Grade Group: 4) - Signed by [name], MD on 11/18/2022 Stage prefix: Initial diagnosis Prostate specific antigen (PSA) range: 20 or greater Gleason score: 8 Histologic grading system: 5 grade system   11/30/2022 -  Chemotherapy   Patient is on Treatment Plan : PROSTATE Docetaxel (75) q21d       ALLERGIES:  has No Known Allergies.  MEDICATIONS:  Current Outpatient Medications  Medication Sig Dispense Refill   Cholecalciferol (VITAMIN D-1000 MAX ST) 25 MCG (1000 UT) tablet Take 1,000 Units by mouth daily.     CINNAMON PO Take 1 tablet by mouth daily at 6 (six) AM.     COD LIVER OIL PO Take 1 tablet by mouth daily at 6 (six) AM.     cyanocobalamin (VITAMIN B12) 500 MCG tablet Take 1 tablet by mouth daily.     enzalutamide (XTANDI) 40 MG tablet Take 4 tablets (160 mg total) by mouth daily. 120 tablet 0   Ferrous Sulfate (IRON PO) Take 1 tablet  by mouth daily at 6 (six) AM.     FIBER PO Take 1 tablet by mouth daily at 6 (six) AM.     losartan (COZAAR) 25 MG tablet Take 1 tablet by mouth every morning. (Patient not taking: Reported on 11/30/2022)     megestrol (MEGACE) 20 MG tablet Take 1 tablet (20 mg total) by mouth 2 (two) times daily. 30 tablet 0   metFORMIN (GLUCOPHAGE) 500 MG tablet Take 500 mg by mouth every morning.     Multiple Vitamins-Minerals (ZINC PO) Take 1 tablet by mouth  daily at 6 (six) AM.     mupirocin ointment (BACTROBAN) 2 % Apply 1 Application topically 2 (two) times daily. 30 g 0   ondansetron (ZOFRAN) 8 MG tablet Take 1 tablet (8 mg total) by mouth every 8 (eight) hours as needed for nausea or vomiting. (Patient not taking: Reported on 11/30/2022) 30 tablet 1   oxyCODONE-acetaminophen (PERCOCET/ROXICET) 5-325 MG tablet Take 1 tablet by mouth every 6 (six) hours as needed for moderate pain or severe pain.     prochlorperazine (COMPAZINE) 10 MG tablet Take 1 tablet (10 mg total) by mouth every 6 (six) hours as needed for nausea or vomiting. (Patient not taking: Reported on 12/21/2022) 30 tablet 1   tamsulosin (FLOMAX) 0.4 MG CAPS capsule Take 1 capsule (0.4 mg total) by mouth daily. 30 capsule 1   No current facility-administered medications for this visit.    VITAL SIGNS: There were no vitals taken for this visit. There were no vitals filed for this visit.  Estimated body mass index is 19.84 kg/m as calculated from the following:   Height as of 10/19/22: 5\' 8"  (1.727 m).   Weight as of 12/21/22: 59.2 kg (130 lb 8 oz).  LABS: CBC:    Component Value Date/Time   WBC 12.1 (H) 12/21/2022 0846   WBC 9.8 11/18/2022 1029   HGB 13.6 12/21/2022 0846   HCT 40.5 12/21/2022 0846   PLT 221 12/21/2022 0846   MCV 91.4 12/21/2022 0846   NEUTROABS 10.3 (H) 12/21/2022 0846   LYMPHSABS 1.3 12/21/2022 0846   MONOABS 0.4 12/21/2022 0846   EOSABS 0.0 12/21/2022 0846   BASOSABS 0.0 12/21/2022 0846   Comprehensive Metabolic Panel:    Component Value Date/Time   NA 132 (L) 12/21/2022 0846   K 4.2 12/21/2022 0846   CL 99 12/21/2022 0846   CO2 26 12/21/2022 0846   BUN 20 12/21/2022 0846   CREATININE 0.68 12/21/2022 0846   GLUCOSE 341 (H) 12/21/2022 0846   CALCIUM 8.9 12/21/2022 0846   AST 84 (H) 01/04/2023 1031   AST 67 (H) 12/21/2022 0846   ALT 109 (H) 01/04/2023 1031   ALT 157 (H) 12/21/2022 0846   ALKPHOS 135 (H) 01/04/2023 1031   BILITOT 0.8 01/04/2023  1031   BILITOT 0.9 12/21/2022 0846   PROT 6.2 (L) 01/04/2023 1031   ALBUMIN 2.7 (L) 01/04/2023 1031     Present during today's visit: patient and his 2 sons  Start plan: Patient will start his enzalutamide tomorrow 01/05/23 after med delivery   Patient Education I spoke with patient for overview of new oral chemotherapy medication: enzalutamide   Dr. Cathie Hoops reviewed today's labs and okayed patient to begin enzalutamide.  Administration: Counseled patient on administration, dosing, side effects, monitoring, drug-food interactions, safe handling, storage, and disposal. Patient will take 4 tablets (160 mg total) by mouth daily.   Side Effects: Side effects include but not limited to: fatigue.    Drug-drug  Interactions (DDI): No current DDis with enzalutamide  Adherence: After discussion with patient no patient barriers to medication adherence identified.  Reviewed with patient importance of keeping a medication schedule and plan for any missed doses.  The [facility] voiced understanding and appreciation. All questions answered. Medication handout provided.  Provided patient with Oral Chemotherapy Navigation Clinic phone number. Patient knows to call the office with questions or concerns. Oral Chemotherapy Navigation Clinic will continue to follow.  Patient expressed understanding and was in agreement with this plan. He also understands that He can call clinic at any time with any questions, concerns, or complaints.   Medication Access Issues: Per patient's son Apolinar Junes, medication to be delivered on 01/05/23  Follow-up plan: RTC in 2 weeks  Thank you for allowing me to participate in the care of this patient.   Time Total: 15 mins  Visit consisted of counseling and education on dealing with issues of symptom management in the setting of serious and potentially life-threatening illness.Greater than 50%  of this time was spent counseling and coordinating care related to the above  assessment and plan.  Signed by: Remi Haggard, PharmD, BCPS, Nolon Bussing, CPP Hematology/Oncology Clinical Pharmacist Practitioner [facility]/DB/AP Oral Chemotherapy Navigation Clinic [phone]  01/04/2023 12:02 PM

## 2023-01-09 ENCOUNTER — Other Ambulatory Visit: Payer: Self-pay | Admitting: Internal Medicine

## 2023-01-09 DIAGNOSIS — B182 Chronic viral hepatitis C: Secondary | ICD-10-CM

## 2023-01-11 ENCOUNTER — Ambulatory Visit: Payer: PPO

## 2023-01-11 ENCOUNTER — Ambulatory Visit: Payer: PPO | Admitting: Oncology

## 2023-01-11 ENCOUNTER — Other Ambulatory Visit: Payer: PPO

## 2023-01-13 ENCOUNTER — Ambulatory Visit: Payer: PPO

## 2023-01-16 ENCOUNTER — Encounter: Payer: Self-pay | Admitting: Oncology

## 2023-01-18 ENCOUNTER — Inpatient Hospital Stay: Payer: PPO

## 2023-01-18 ENCOUNTER — Inpatient Hospital Stay (HOSPITAL_BASED_OUTPATIENT_CLINIC_OR_DEPARTMENT_OTHER): Payer: PPO | Admitting: Oncology

## 2023-01-18 ENCOUNTER — Encounter: Payer: Self-pay | Admitting: Oncology

## 2023-01-18 ENCOUNTER — Ambulatory Visit
Admission: RE | Admit: 2023-01-18 | Discharge: 2023-01-18 | Disposition: A | Discharge status: Home / Self Care | Payer: PPO | Source: Ambulatory Visit | Attending: Oncology | Admitting: Oncology

## 2023-01-18 VITALS — BP 121/85 | HR 78 | Temp 97.6°F | Resp 18 | Wt 140.4 lb

## 2023-01-18 DIAGNOSIS — R7989 Other specified abnormal findings of blood chemistry: Secondary | ICD-10-CM | POA: Diagnosis not present

## 2023-01-18 DIAGNOSIS — R7401 Elevation of levels of liver transaminase levels: Secondary | ICD-10-CM

## 2023-01-18 DIAGNOSIS — G893 Neoplasm related pain (acute) (chronic): Secondary | ICD-10-CM

## 2023-01-18 DIAGNOSIS — C61 Malignant neoplasm of prostate: Secondary | ICD-10-CM

## 2023-01-18 DIAGNOSIS — M7989 Other specified soft tissue disorders: Secondary | ICD-10-CM

## 2023-01-18 DIAGNOSIS — B1920 Unspecified viral hepatitis C without hepatic coma: Secondary | ICD-10-CM

## 2023-01-18 DIAGNOSIS — Z79818 Long term (current) use of other agents affecting estrogen receptors and estrogen levels: Secondary | ICD-10-CM

## 2023-01-18 DIAGNOSIS — Z5111 Encounter for antineoplastic chemotherapy: Secondary | ICD-10-CM | POA: Diagnosis not present

## 2023-01-18 DIAGNOSIS — R600 Localized edema: Secondary | ICD-10-CM

## 2023-01-18 DIAGNOSIS — E1165 Type 2 diabetes mellitus with hyperglycemia: Secondary | ICD-10-CM

## 2023-01-18 DIAGNOSIS — B182 Chronic viral hepatitis C: Secondary | ICD-10-CM

## 2023-01-18 DIAGNOSIS — IMO0001 Reserved for inherently not codable concepts without codable children: Secondary | ICD-10-CM

## 2023-01-18 LAB — CBC WITH DIFFERENTIAL (CANCER CENTER ONLY)
Abs Immature Granulocytes: 0.03 10*3/uL (ref 0.00–0.07)
Basophils Absolute: 0 10*3/uL (ref 0.0–0.1)
Basophils Relative: 1 %
Eosinophils Absolute: 0 10*3/uL (ref 0.0–0.5)
Eosinophils Relative: 1 %
HCT: 37.2 % — ABNORMAL LOW (ref 39.0–52.0)
Hemoglobin: 12 g/dL — ABNORMAL LOW (ref 13.0–17.0)
Immature Granulocytes: 1 %
Lymphocytes Relative: 30 %
Lymphs Abs: 1.2 10*3/uL (ref 0.7–4.0)
MCH: 30.9 pg (ref 26.0–34.0)
MCHC: 32.3 g/dL (ref 30.0–36.0)
MCV: 95.9 fL (ref 80.0–100.0)
Monocytes Absolute: 0.3 10*3/uL (ref 0.1–1.0)
Monocytes Relative: 8 %
Neutro Abs: 2.4 10*3/uL (ref 1.7–7.7)
Neutrophils Relative %: 59 %
Platelet Count: 205 10*3/uL (ref 150–400)
RBC: 3.88 MIL/uL — ABNORMAL LOW (ref 4.22–5.81)
RDW: 16 % — ABNORMAL HIGH (ref 11.5–15.5)
WBC Count: 4 10*3/uL (ref 4.0–10.5)
nRBC: 0 % (ref 0.0–0.2)

## 2023-01-18 LAB — CMP (CANCER CENTER ONLY)
ALT: 155 U/L — ABNORMAL HIGH (ref 0–44)
AST: 156 U/L — ABNORMAL HIGH (ref 15–41)
Albumin: 2.6 g/dL — ABNORMAL LOW (ref 3.5–5.0)
Alkaline Phosphatase: 193 U/L — ABNORMAL HIGH (ref 38–126)
Anion gap: 7 (ref 5–15)
BUN: 13 mg/dL (ref 8–23)
CO2: 27 mmol/L (ref 22–32)
Calcium: 8.5 mg/dL — ABNORMAL LOW (ref 8.9–10.3)
Chloride: 102 mmol/L (ref 98–111)
Creatinine: 0.71 mg/dL (ref 0.61–1.24)
GFR, Estimated: >60 mL/min (ref >60)
Glucose, Bld: 222 mg/dL — ABNORMAL HIGH (ref 70–99)
Potassium: 4.4 mmol/L (ref 3.5–5.1)
Sodium: 136 mmol/L (ref 135–145)
Total Bilirubin: 0.8 mg/dL (ref 0.3–1.2)
Total Protein: 6.2 g/dL — ABNORMAL LOW (ref 6.5–8.1)

## 2023-01-18 LAB — PSA: Prostatic Specific Antigen: 9.71 ng/mL — ABNORMAL HIGH (ref 0.00–4.00)

## 2023-01-18 MED ORDER — FUROSEMIDE 20 MG PO TABS: 20.0000 mg | ORAL_TABLET | Freq: Every day | ORAL | 0 refills | Status: DC | PRN

## 2023-01-18 MED ORDER — LEUPROLIDE ACETATE (6 MONTH) 45 MG ~~LOC~~ KIT
45.0000 mg | PACK | Freq: Once | SUBCUTANEOUS | Status: AC
  Administered 2023-01-18: 45 mg via SUBCUTANEOUS
  Filled 2023-01-18: qty 45

## 2023-01-18 NOTE — Progress Notes (Signed)
Pt here for follow up. Reports swelling to both feet, worse on right foot.  Swelling has been going on for about 2-3 weeks and not getting better.

## 2023-01-18 NOTE — Assessment & Plan Note (Addendum)
Due to hepatitis C and chemotherapy. Continue follow-up with GI

## 2023-01-18 NOTE — Assessment & Plan Note (Signed)
Check ultrasound lower extremity to rule out DVT.-Studies negative. Recommend Lasix 20 mg daily as needed edema.

## 2023-01-18 NOTE — Assessment & Plan Note (Addendum)
Recommend patient to take metformin and follow up with PCP

## 2023-01-18 NOTE — Assessment & Plan Note (Signed)
Percocet 5/325 5 mg every 6 hours as needed for pain  

## 2023-01-18 NOTE — Assessment & Plan Note (Signed)
Follow-up with gastroenterology for hepatitis C treatments.

## 2023-01-18 NOTE — Assessment & Plan Note (Addendum)
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 [employer], high-volume disease. Labs are reviewed and discussed with patient. Patient has persistent transaminitis secondary to HCV He has been on Xtandi 160 mg daily for 2 weeks.  Transaminitis is worse. Recommend patient to hold treatment. Marland Kitchen

## 2023-01-18 NOTE — Addendum Note (Signed)
Addended by: [name] on: 01/18/2023 10:47 PM   Modules accepted: Orders

## 2023-01-18 NOTE — Progress Notes (Signed)
Nutrition Follow-up:  Patient with stage IV prostate cancer.  Patient receiving enzalutamide oral chemotherapy.    Met with patient and son following MD visit.  Patient reports that his appetite is better.  Ate 2 eggs, bacon, biscuit and jelly this am.  Last night ate at [employer] (meats, vegetables, salad, ice cream).  Has been drinking boost shakes 2-3 a day.  Having issues with constipation.  Says that food is tasting better.    Has seen GI for Hep C  Medications: reviewed  Labs: reviewed  Anthropometrics:   Weight 140 lb 6.4 oz today (bilateral edema in ankles) 130 lb 8 oz on 5/22 122 lb on 5/10 131 lb 6.4 oz on 5/1  UBW of 170-175 lb   NUTRITION DIAGNOSIS: Inadequate oral intake continues    INTERVENTION:  Continue oral nutrition supplement Continue high calorie, high protein foods    MONITORING, EVALUATION, GOAL: weight trends, intake   NEXT VISIT: to be determined  Tejah Brekke B. Freida Busman, RD, LDN Registered Dietitian [phone]

## 2023-01-18 NOTE — Progress Notes (Addendum)
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 [employer], high-volume disease. Labs are reviewed and discussed with patient. Patient has persistent transaminitis secondary to HCV He has been on Xtandi 160 mg daily for 2 weeks.  Transaminitis is worse. Recommend patient to hold treatment. .     Transaminitis Due to hepatitis C and chemotherapy. Continue follow-up with GI  Neoplasm related pain Percocet 5/325 5 mg every 6 hours as needed for pain   Uncontrolled diabetes mellitus with hyperglycemia (HCC) Recommend patient to take metformin and follow up with PCP  HCV (hepatitis C virus) Follow-up with gastroenterology for hepatitis C treatments.  Androgen deprivation therapy Proceed with Eligard 45 mg today.  Every 6 months.  Leg edema Check ultrasound lower extremity to rule out DVT.-Studies negative. Recommend Lasix 20 mg daily as needed edema.   Orders Placed This Encounter  Procedures   US Venous Img Lower Bilateral    Standing Status:   Future    Number of Occurrences:   1    Standing Expiration Date:   01/18/2024    Order Specific Question:   Reason for Exam (SYMPTOM  OR DIAGNOSIS REQUIRED)    Answer:   bilat leg swelling    Order Specific Question:   Preferred imaging location?    Answer:   [facility]    Order Specific Question:   Call Results- Best Contact Number?    Answer:   [phone] / do not hold patient   Hepatic function panel    Standing Status:   Future    Standing Expiration Date:    01/18/2024   Follow-up in   2 weeks lab MD + Eligard We spent sufficient time to discuss many aspect of care, questions were answered to patient's satisfaction.  [name], MD, PhD [facility] Hematology Oncology 01/18/2023    HISTORY OF PRESENTING ILLNESS:  Nicolas Gonzales 72 y.o. male presents to establish care for  I have reviewed his chart and materials related to his cancer extensively and collaborated history with the patient. Summary of oncologic history is as follows: Oncology History  Prostate cancer (HCC)  10/11/2022 Initial Diagnosis   Prostate cancer  Patient was noted to have a PSA of 44.  Patient had urology workup and status post prostate biopsy. 10/11/2022, prostate biopsy showed [A] PROSTATE, LEFT BASE:   ACINAR ADENOCARCINOMA, GLEASON 3+4=7  (GG2), INVOLVING 1 OF 1 CORES, MEASURING 14  MM ( 93%). 30% GLEASON PATTERN 4; PERINEURAL INVASION PRESENT  [B] PROSTATE, LEFT MID:   ACINAR ADENOCARCINOMA, GLEASON 3+4=7  (GG 2),INVOLVING 2 OF 2 CORES, MEASURING 26  MM ( 96%). 20% GLEASON PATTERN 4;CRIBRIFORM PATTERN 4 AND PERINEURAL INVASION PRESENT  [C] PROSTATE, LEFT APEX:   ACINAR ADENOCARCINOMA, GLEASON 3+4=7  (GG2), INVOLVING 2 OF 3 CORES, MEASURING 17  MM ( 77%). 20% GLEASON PATTERN 4; CRIBRIFORM PATTERN 4 AND PERINEURAL INVASION PRESENT  [D] PROSTATE, RIGHT BASE:   ACINAR ADENOCARCINOMA, GLEASON 4+4=8  (GG4), INVOLVING 3 OF 3 CORES, MEASURING 15  MM ( 68%). CRIBRIFORM PATTERN 4 AND PERINEURAL  INVASION PRESENT  [E] PROSTATE, RIGHT MID:   ACINAR ADENOCARCINOMA, GLEASON 4+3=7  (GG3), INVOLVING 1 OF 3 CORES, MEASURING 12  MM ( 50%). 80% GLEASON PATTERN 4; CRIBRIFORM PATTERN 4 PRESENT  [F] PROSTATE, RIGHT APEX:   ACINAR ADENOCARCINOMA, GLEASON 4+3=7  (GG3), INVOLVING 4 OF 5 CORES, MEASURING 23  MM ( 70%). 80% GLEASON PATTERN 4; TERTIARY PATTERN 5; CRIBRIFORM PATTERN 4 AND PNI PRESEN      11/09/2022 Imaging   PMSA PET scan showed 1. Locally advanced prostate primary with nodal  metastasis in the chest, abdomen, and pelvis. 2. Diffuse, large volume osseous metastasis. 3. Incidental findings, including: Coronary artery atherosclerosis. Aortic Atherosclerosis (ICD10-I70.0). Possible constipation.   11/18/2022 Cancer Staging   Staging form: Prostate, AJCC 8th Edition - Clinical stage from 11/18/2022: Stage IVB (cTX, cM1, PSA: 44, Grade Group: 4) - Signed by [name], MD on 11/18/2022 Stage prefix: Initial diagnosis Prostate specific antigen (PSA) range: 20 or greater Gleason score: 8 Histologic grading system: 5 grade system   11/30/2022 -  Chemotherapy   Patient is on Treatment Plan : PROSTATE Docetaxel (75) q21d      Patient reports left hip pain radiating down to left lower extremity. His primary care provider prescribed oxycodone/acetaminophen 5/325 mg every 6 hours as needed.  He has not tried due to concern of side effects. Family history positive for brother with prostate cancer.  Today he was accompanied by 2 of his sons. Patient is active for his age.  He goes to gym 1-2 times per week.   INTERVAL HISTORY Nicolas Gonzales is a 72 y.o. male who has above history reviewed by me today presents for follow up visit for prostate cancer management.  Appetite is good.  He has gained weight.  He has establish care with gastroenterology. Denies any alcohol use.  Patient tolerates Xtandi 160 mg.  He denies any new symptoms.  MEDICAL HISTORY:  Past Medical History:  Diagnosis Date   BPH (benign prostatic hyperplasia)    Diabetes mellitus without complication (HCC)    Elevated PSA    Hypertension     SURGICAL HISTORY: Past Surgical History:  Procedure Laterality Date   NO PAST SURGERIES     PROSTATE BIOPSY N/A 10/11/2022   Procedure: PROSTATE BIOPSY;  Surgeon: Riki Altes, MD;  Location: [facility] ORS;  Service: Urology;  Laterality: N/A;   TRANSRECTAL ULTRASOUND N/A 10/11/2022   Procedure: TRANSRECTAL ULTRASOUND;  Surgeon: Riki Altes, MD;  Location: [facility]  ORS;  Service: Urology;  Laterality: N/A;    SOCIAL HISTORY: Social History   Socioeconomic History   Marital status: Married    Spouse name: Not on file   Number of children: Not on file   Years of education: Not on file   Highest education level: Not on file  Occupational History   Not on file  Tobacco Use   Smoking status: Never   Smokeless tobacco: Never  Vaping Use   Vaping Use: Not on file  Substance and Sexual Activity   Alcohol use: Never   Drug use: Never   Sexual activity: Not on file  Other Topics Concern   Not on file  Social History Narrative   Not on file   Social Determinants of Health   Financial Resource Strain: Not on file  Food Insecurity: No Food Insecurity (11/18/2022)   Hunger Vital Sign    Worried About Running Out of Food in the Last Year: Never true    Ran Out of Food  in the Last Year: Never true  Transportation Needs: No Transportation Needs (11/18/2022)   PRAPARE - Administrator, Civil Service (Medical): No    Lack of Transportation (Non-Medical): No  Physical Activity: Not on file  Stress: Not on file  Social Connections: Not on file  Intimate Partner Violence: Not At Risk (11/18/2022)   Humiliation, Afraid, Rape, and Kick questionnaire    Fear of Current or Ex-Partner: No    Emotionally Abused: No    Physically Abused: No    Sexually Abused: No    FAMILY HISTORY: Family History  Problem Relation Age of Onset   Breast cancer Mother    Alzheimer's disease Father    Prostate cancer Brother     ALLERGIES:  has No Known Allergies.  MEDICATIONS:  Current Outpatient Medications  Medication Sig Dispense Refill   Cholecalciferol (VITAMIN D-1000 MAX ST) 25 MCG (1000 UT) tablet Take 1,000 Units by mouth daily.     CINNAMON PO Take 1 tablet by mouth daily at 6 (six) AM.     COD LIVER OIL PO Take 1 tablet by mouth daily at 6 (six) AM.     cyanocobalamin (VITAMIN B12) 500 MCG tablet Take 1 tablet by mouth daily.      enzalutamide (XTANDI) 40 MG tablet Take 4 tablets (160 mg total) by mouth daily. 120 tablet 0   FIBER PO Take 1 tablet by mouth daily at 6 (six) AM.     megestrol (MEGACE) 20 MG tablet Take 1 tablet (20 mg total) by mouth 2 (two) times daily. 30 tablet 0   Multiple Vitamins-Minerals (ZINC PO) Take 1 tablet by mouth daily at 6 (six) AM.     oxyCODONE-acetaminophen (PERCOCET/ROXICET) 5-325 MG tablet Take 1 tablet by mouth every 6 (six) hours as needed for moderate pain or severe pain.     tamsulosin (FLOMAX) 0.4 MG CAPS capsule Take 1 capsule (0.4 mg total) by mouth daily. 30 capsule 1   metFORMIN (GLUCOPHAGE) 500 MG tablet Take 500 mg by mouth every morning. (Patient not taking: Reported on 01/18/2023)     ondansetron (ZOFRAN) 8 MG tablet Take 1 tablet (8 mg total) by mouth every 8 (eight) hours as needed for nausea or vomiting. (Patient not taking: Reported on 11/30/2022) 30 tablet 1   prochlorperazine (COMPAZINE) 10 MG tablet Take 1 tablet (10 mg total) by mouth every 6 (six) hours as needed for nausea or vomiting. (Patient not taking: Reported on 12/21/2022) 30 tablet 1   No current facility-administered medications for this visit.    Review of Systems  Constitutional:  Positive for fatigue. Negative for appetite change, chills, fever and unexpected weight change.  HENT:   Negative for hearing loss and voice change.   Eyes:  Negative for eye problems and icterus.  Respiratory:  Negative for chest tightness, cough and shortness of breath.   Cardiovascular:  Negative for chest pain and leg swelling.  Gastrointestinal:  Negative for abdominal distention and abdominal pain.       Umbilical discharge  Endocrine: Negative for hot flashes.  Genitourinary:  Negative for difficulty urinating, dysuria and frequency.   Musculoskeletal:  Negative for arthralgias.       Left hip pain  Skin:  Negative for itching and rash.  Neurological:  Negative for light-headedness and numbness.  Hematological:   Negative for adenopathy. Does not bruise/bleed easily.  Psychiatric/Behavioral:  Negative for confusion.      PHYSICAL EXAMINATION: ECOG PERFORMANCE STATUS: 1 - Symptomatic but completely  ambulatory  Vitals:   01/18/23 1350  BP: 121/85  Pulse: 78  Resp: 18  Temp: 97.6 F (36.4 C)   Filed Weights   01/18/23 1350  Weight: 140 lb 6.4 oz (63.7 kg)    Physical Exam Constitutional:      Appearance: He is not diaphoretic.  HENT:     Head: Normocephalic and atraumatic.  Eyes:     General: No scleral icterus. Cardiovascular:     Rate and Rhythm: Normal rate and regular rhythm.  Pulmonary:     Effort: Pulmonary effort is normal. No respiratory distress.  Abdominal:     General: There is no distension.     Palpations: Abdomen is soft.     Tenderness: There is no abdominal tenderness.  Musculoskeletal:        General: Normal range of motion.     Cervical back: Normal range of motion and neck supple.  Skin:    General: Skin is warm and dry.     Findings: No erythema.  Neurological:     Mental Status: He is alert and oriented to person, place, and time. Mental status is at baseline.     Cranial Nerves: No cranial nerve deficit.     Motor: No abnormal muscle tone.  Psychiatric:        Mood and Affect: Affect normal.      LABORATORY DATA:  I have reviewed the data as listed    Latest Ref Rng & Units 01/18/2023    1:33 PM 12/21/2022    8:46 AM 12/14/2022    9:28 AM  CBC  WBC 4.0 - 10.5 K/uL 4.0  12.1  15.1   Hemoglobin 13.0 - 17.0 g/dL 16.1  09.6  04.5   Hematocrit 39.0 - 52.0 % 37.2  40.5  47.0   Platelets 150 - 400 K/uL 205  221  122       Latest Ref Rng & Units 01/18/2023    1:33 PM 01/04/2023   10:31 AM 12/21/2022    8:46 AM  CMP  Glucose 70 - 99 mg/dL 409   811   BUN 8 - 23 mg/dL 13   20   Creatinine 9.14 - 1.24 mg/dL 7.82   9.56   Sodium 213 - 145 mmol/L 136   132   Potassium 3.5 - 5.1 mmol/L 4.4   4.2   Chloride 98 - 111 mmol/L 102   99   CO2 22 - 32 mmol/L  27   26   Calcium 8.9 - 10.3 mg/dL 8.5   8.9   Total Protein 6.5 - 8.1 g/dL 6.2  6.2  5.9   Total Bilirubin 0.3 - 1.2 mg/dL 0.8  0.8  0.9   Alkaline Phos 38 - 126 U/L 193  135  133   AST 15 - 41 U/L 156  84  67   ALT 0 - 44 U/L 155  109  157      RADIOGRAPHIC STUDIES: I have personally reviewed the radiological images as listed and agreed with the findings in the report. US Venous Img Lower Bilateral  Result Date: 01/18/2023 CLINICAL DATA:  bilat leg swelling EXAM: BILATERAL LOWER EXTREMITY VENOUS DOPPLER ULTRASOUND TECHNIQUE: Gray-scale sonography with graded compression, as well as color Doppler and duplex ultrasound were performed to evaluate the lower extremity deep venous systems from the level of the common femoral vein and including the common femoral, femoral, profunda femoral, popliteal and calf veins including the posterior tibial, peroneal and gastrocnemius veins  when visible. The superficial great saphenous vein was also interrogated. Spectral Doppler was utilized to evaluate flow at rest and with distal augmentation maneuvers in the common femoral, femoral and popliteal veins. COMPARISON:  None Available. FINDINGS: RIGHT LOWER EXTREMITY Common Femoral Vein: No evidence of thrombus. Normal compressibility, respiratory phasicity and response to augmentation. Saphenofemoral Junction: No evidence of thrombus. Normal compressibility and flow on color Doppler imaging. Profunda Femoral Vein: No evidence of thrombus. Normal compressibility and flow on color Doppler imaging. Femoral Vein: No evidence of thrombus. Normal compressibility, respiratory phasicity and response to augmentation. Popliteal Vein: No evidence of thrombus. Normal compressibility, respiratory phasicity and response to augmentation. Calf Veins: No evidence of thrombus. Normal compressibility and flow on color Doppler imaging. LEFT LOWER EXTREMITY Common Femoral Vein: No evidence of thrombus. Normal compressibility, respiratory  phasicity and response to augmentation. Saphenofemoral Junction: No evidence of thrombus. Normal compressibility and flow on color Doppler imaging. Profunda Femoral Vein: No evidence of thrombus. Normal compressibility and flow on color Doppler imaging. Femoral Vein: No evidence of thrombus. Normal compressibility, respiratory phasicity and response to augmentation. Popliteal Vein: No evidence of thrombus. Normal compressibility, respiratory phasicity and response to augmentation. Calf Veins: No evidence of thrombus. Normal compressibility and flow on color Doppler imaging. IMPRESSION: No evidence of deep venous thrombosis in either lower extremity. Electronically Signed   By: Feliberto Harts M.D.   On: 01/18/2023 16:49   US Abdomen Limited RUQ (LIVER/GB)  Result Date: 12/22/2022 CLINICAL DATA:  Hepatitis-C EXAM: ULTRASOUND ABDOMEN LIMITED RIGHT UPPER QUADRANT COMPARISON:  CT renal stone 03/29/2022 FINDINGS: Gallbladder: No gallstones or wall thickening visualized. No sonographic Murphy sign noted by sonographer. Common bile duct: Diameter: 3 mm Liver: Coarsened increased echogenicity. No focal lesion. Portal vein is patent on color Doppler imaging with normal direction of blood flow towards the liver. Other: None. IMPRESSION: 1. No cholelithiasis or sonographic evidence for acute cholecystitis. 2. Increased hepatic parenchymal echogenicity suggestive of steatosis. Electronically Signed   By: Annia Belt M.D.   On: 12/22/2022 10:02

## 2023-01-18 NOTE — Assessment & Plan Note (Signed)
Proceed with Eligard 45 mg today.  Every 6 months.

## 2023-01-19 ENCOUNTER — Other Ambulatory Visit: Payer: Self-pay | Admitting: Oncology

## 2023-01-19 ENCOUNTER — Telehealth: Payer: Self-pay

## 2023-01-19 DIAGNOSIS — C61 Malignant neoplasm of prostate: Secondary | ICD-10-CM

## 2023-01-19 NOTE — Telephone Encounter (Signed)
-----   Message from [name], MD sent at 01/18/2023 10:47 PM EDT ----- Please let patient know that ultrasound showed no DVT.  I recommend patient to take Lasix 20 mg daily as needed for lower extremity edema.  Prescription was sent to pharmacy.

## 2023-01-19 NOTE — Telephone Encounter (Signed)
Called and spoke to patient's son and informed him that Ultrasound showed no DVT and that Dr. Cathie Hoops is recommending that he take Lasix 20mg  daily for edema. Informed son that Rx is at the pharmacy. Patient's son gave verbal understanding to this information.

## 2023-01-26 ENCOUNTER — Other Ambulatory Visit: Payer: Self-pay | Admitting: Internal Medicine

## 2023-01-26 ENCOUNTER — Ambulatory Visit
Admission: RE | Admit: 2023-01-26 | Discharge: 2023-01-26 | Disposition: A | Discharge status: Home / Self Care | Payer: PPO | Source: Ambulatory Visit | Attending: Internal Medicine | Admitting: Internal Medicine

## 2023-01-26 DIAGNOSIS — B182 Chronic viral hepatitis C: Secondary | ICD-10-CM

## 2023-01-28 ENCOUNTER — Other Ambulatory Visit: Payer: Self-pay

## 2023-01-31 ENCOUNTER — Other Ambulatory Visit: Payer: Self-pay | Admitting: Oncology

## 2023-02-01 ENCOUNTER — Inpatient Hospital Stay (HOSPITAL_BASED_OUTPATIENT_CLINIC_OR_DEPARTMENT_OTHER): Payer: PPO | Admitting: Oncology

## 2023-02-01 ENCOUNTER — Other Ambulatory Visit: Payer: PPO

## 2023-02-01 ENCOUNTER — Encounter: Payer: Self-pay | Admitting: Oncology

## 2023-02-01 ENCOUNTER — Inpatient Hospital Stay: Payer: PPO | Attending: Oncology

## 2023-02-01 ENCOUNTER — Ambulatory Visit: Payer: PPO | Admitting: Oncology

## 2023-02-01 ENCOUNTER — Ambulatory Visit: Payer: PPO

## 2023-02-01 VITALS — BP 115/75 | HR 77 | Temp 97.1°F | Resp 18 | Ht 68.0 in | Wt 147.1 lb

## 2023-02-01 DIAGNOSIS — R7401 Elevation of levels of liver transaminase levels: Secondary | ICD-10-CM | POA: Diagnosis not present

## 2023-02-01 DIAGNOSIS — G893 Neoplasm related pain (acute) (chronic): Secondary | ICD-10-CM | POA: Insufficient documentation

## 2023-02-01 DIAGNOSIS — Z79899 Other long term (current) drug therapy: Secondary | ICD-10-CM | POA: Diagnosis not present

## 2023-02-01 DIAGNOSIS — C61 Malignant neoplasm of prostate: Secondary | ICD-10-CM | POA: Insufficient documentation

## 2023-02-01 DIAGNOSIS — C7951 Secondary malignant neoplasm of bone: Secondary | ICD-10-CM | POA: Insufficient documentation

## 2023-02-01 DIAGNOSIS — Z79818 Long term (current) use of other agents affecting estrogen receptors and estrogen levels: Secondary | ICD-10-CM

## 2023-02-01 DIAGNOSIS — R7989 Other specified abnormal findings of blood chemistry: Secondary | ICD-10-CM

## 2023-02-01 DIAGNOSIS — B182 Chronic viral hepatitis C: Secondary | ICD-10-CM | POA: Diagnosis not present

## 2023-02-01 LAB — HEPATIC FUNCTION PANEL
ALT: 58 U/L — ABNORMAL HIGH (ref 0–44)
AST: 57 U/L — ABNORMAL HIGH (ref 15–41)
Albumin: 2.9 g/dL — ABNORMAL LOW (ref 3.5–5.0)
Alkaline Phosphatase: 173 U/L — ABNORMAL HIGH (ref 38–126)
Bilirubin, Direct: 0.3 mg/dL — ABNORMAL HIGH (ref 0.0–0.2)
Indirect Bilirubin: 0.5 mg/dL (ref 0.3–0.9)
Total Bilirubin: 0.8 mg/dL (ref 0.3–1.2)
Total Protein: 6.6 g/dL (ref 6.5–8.1)

## 2023-02-01 MED ORDER — XTANDI 40 MG PO CAPS: 120.0000 mg | ORAL_CAPSULE | Freq: Every day | ORAL | 0 refills | Status: DC

## 2023-02-01 NOTE — Assessment & Plan Note (Signed)
Follow-up with gastroenterology for hepatitis C treatments. 

## 2023-02-01 NOTE — Assessment & Plan Note (Signed)
Due to hepatitis C and chemotherapy. Continue follow-up with GI 

## 2023-02-01 NOTE — Assessment & Plan Note (Signed)
Percocet 5/325 5 mg every 6 hours as needed for pain  

## 2023-02-01 NOTE — Assessment & Plan Note (Addendum)
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 [employer], high-volume disease. Labs are reviewed and discussed with patient. Patient has persistent transaminitis secondary to HCV Xtandi 160 mg daily  on hold due to transaminitis Resume at 120mg  daily.  Weekly LFT .

## 2023-02-01 NOTE — Progress Notes (Signed)
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 [employer], high-volume disease. Labs are reviewed and discussed with patient. Patient has persistent transaminitis secondary to HCV Xtandi 160 mg daily  on hold due to transaminitis Resume at 120mg  daily.  Weekly LFT .     HCV (hepatitis C virus) Follow-up with gastroenterology for hepatitis C treatments.  Androgen deprivation therapy Eligard 45 mg  01/18/23 - next due around 07/20/23   Neoplasm related pain Percocet 5/325 5 mg every 6 hours as needed for pain   Transaminitis Due to hepatitis C and chemotherapy. Continue follow-up with GI   Orders Placed This Encounter  Procedures   CBC with Differential (Cancer Center Only)    Standing Status:   Future    Standing Expiration Date:   02/01/2024   CMP (Cancer Center only)    Standing Status:   Future    Standing Expiration Date:   02/01/2024   PSA    Standing Status:   Future    Standing Expiration Date:   02/01/2024   CBC with Differential (Cancer Center Only)    Standing Status:   Future    Standing Expiration Date:   02/01/2024   CBC with Differential (Cancer Center Only)    Standing Status:   Future    Standing Expiration Date:   02/01/2024   Follow-up in 3 weeks.  We spent sufficient time to discuss many aspect of care, questions were answered to patient's satisfaction.  [name], MD, PhD [facility] Hematology Oncology 02/01/2023    HISTORY OF PRESENTING ILLNESS:  Nicolas Gonzales 72 y.o. male presents to establish  care for prostate cancer I have reviewed his chart and materials related to his cancer extensively and collaborated history with the patient. Summary of oncologic history is as follows: Oncology History  Prostate cancer (HCC)  10/11/2022 Initial Diagnosis   Prostate cancer  Patient was noted to have a PSA of 44.  Patient had urology workup and status post prostate biopsy. 10/11/2022, prostate biopsy showed [A] PROSTATE, LEFT BASE:   ACINAR ADENOCARCINOMA, GLEASON 3+4=7  (GG2), INVOLVING 1 OF 1 CORES, MEASURING 14  MM ( 93%). 30% GLEASON PATTERN 4; PERINEURAL INVASION PRESENT  [B] PROSTATE, LEFT MID:   ACINAR ADENOCARCINOMA, GLEASON 3+4=7  (GG 2),INVOLVING 2 OF 2 CORES, MEASURING 26  MM ( 96%). 20% GLEASON PATTERN 4;CRIBRIFORM PATTERN 4 AND PERINEURAL INVASION PRESENT  [C] PROSTATE, LEFT APEX:   ACINAR ADENOCARCINOMA, GLEASON 3+4=7  (GG2), INVOLVING 2 OF 3 CORES, MEASURING 17  MM ( 77%). 20% GLEASON PATTERN 4; CRIBRIFORM PATTERN 4 AND PERINEURAL INVASION PRESENT  [D] PROSTATE, RIGHT BASE:   ACINAR ADENOCARCINOMA, GLEASON 4+4=8  (GG4), INVOLVING 3 OF 3 CORES, MEASURING 15  MM ( 68%). CRIBRIFORM PATTERN 4 AND PERINEURAL INVASION PRESENT  [E] PROSTATE, RIGHT MID:   ACINAR ADENOCARCINOMA, GLEASON 4+3=7  (GG3), INVOLVING 1 OF 3 CORES, MEASURING 12  MM ( 50%). 80% GLEASON PATTERN 4; CRIBRIFORM PATTERN 4 PRESENT  [F] PROSTATE, RIGHT APEX:   ACINAR ADENOCARCINOMA, GLEASON 4+3=7  (GG3), INVOLVING 4  OF 5 CORES, MEASURING 23  MM ( 70%). 80% GLEASON PATTERN 4; TERTIARY PATTERN 5; CRIBRIFORM PATTERN 4 AND PNI PRESEN      11/09/2022 Imaging   PMSA PET scan showed 1. Locally advanced prostate primary with nodal metastasis in the chest, abdomen, and pelvis. 2. Diffuse, large volume osseous metastasis. 3. Incidental findings, including: Coronary artery atherosclerosis. Aortic Atherosclerosis (ICD10-I70.0). Possible constipation.   11/18/2022 Cancer Staging   Staging form: Prostate, AJCC 8th Edition - Clinical  stage from 11/18/2022: Stage IVB (cTX, cM1, PSA: 44, Grade Group: 4) - Signed by [name], MD on 11/18/2022 Stage prefix: Initial diagnosis Prostate specific antigen (PSA) range: 20 or greater Gleason score: 8 Histologic grading system: 5 grade system   11/30/2022 - 12/02/2022 Chemotherapy   Patient is on Treatment Plan : PROSTATE Docetaxel (75) q21d      Patient reports left hip pain radiating down to left lower extremity. His primary care provider prescribed oxycodone/acetaminophen 5/325 mg every 6 hours as needed.  He has not tried due to concern of side effects. Family history positive for brother with prostate cancer.  Today he was accompanied by 2 of his sons. Patient is active for his age.  He goes to gym 1-2 times per week.   INTERVAL HISTORY Nicolas Gonzales is a 72 y.o. male who has above history reviewed by me today presents for follow up visit for prostate cancer management.  Appetite is good.  He has gained weight.  He has establish care with gastroenterology. Denies any alcohol use.   He denies any new symptoms.  MEDICAL HISTORY:  Past Medical History:  Diagnosis Date   BPH (benign prostatic hyperplasia)    Diabetes mellitus without complication (HCC)    Elevated PSA    Hypertension     SURGICAL HISTORY: Past Surgical History:  Procedure Laterality Date   NO PAST SURGERIES     PROSTATE BIOPSY N/A 10/11/2022   Procedure: PROSTATE BIOPSY;  Surgeon: Riki Altes, MD;  Location: [facility] ORS;  Service: Urology;  Laterality: N/A;   TRANSRECTAL ULTRASOUND N/A 10/11/2022   Procedure: TRANSRECTAL ULTRASOUND;  Surgeon: Riki Altes, MD;  Location: [facility] ORS;  Service: Urology;  Laterality: N/A;    SOCIAL HISTORY: Social History   Socioeconomic History   Marital status: Married    Spouse name: Not on file   Number of children: Not on file   Years of education: Not on file   Highest education level: Not on file  Occupational History   Not on file  Tobacco Use    Smoking status: Never   Smokeless tobacco: Never  Vaping Use   Vaping Use: Not on file  Substance and Sexual Activity   Alcohol use: Never   Drug use: Never   Sexual activity: Not on file  Other Topics Concern   Not on file  Social History Narrative   Not on file   Social Determinants of Health   Financial Resource Strain: Not on file  Food Insecurity: No Food Insecurity (11/18/2022)   Hunger Vital Sign    Worried About Running Out of Food in the Last Year: Never true    Ran Out of Food in the Last Year: Never true  Transportation Needs: No Transportation Needs (11/18/2022)   PRAPARE - Administrator, Civil Service (Medical): No    Lack of Transportation (Non-Medical): No  Physical Activity: Not on file  Stress: Not on file  Social Connections: Not on file  Intimate Partner  Violence: Not At Risk (11/18/2022)   Humiliation, Afraid, Rape, and Kick questionnaire    Fear of Current or Ex-Partner: No    Emotionally Abused: No    Physically Abused: No    Sexually Abused: No    FAMILY HISTORY: Family History  Problem Relation Age of Onset   Breast cancer Mother    Alzheimer's disease Father    Prostate cancer Brother     ALLERGIES:  has No Known Allergies.  MEDICATIONS:  Current Outpatient Medications  Medication Sig Dispense Refill   Cholecalciferol (VITAMIN D-1000 MAX ST) 25 MCG (1000 UT) tablet Take 1,000 Units by mouth daily.     CINNAMON PO Take 1 tablet by mouth daily at 6 (six) AM.     COD LIVER OIL PO Take 1 tablet by mouth daily at 6 (six) AM.     cyanocobalamin (VITAMIN B12) 500 MCG tablet Take 1 tablet by mouth daily.     enzalutamide (XTANDI) 40 MG capsule Take 3 capsules (120 mg total) by mouth daily. 90 capsule 0   FIBER PO Take 1 tablet by mouth daily at 6 (six) AM.     furosemide (LASIX) 20 MG tablet Take 1 tablet (20 mg total) by mouth daily as needed. 30 tablet 0   megestrol (MEGACE) 20 MG tablet Take 1 tablet (20 mg total) by mouth 2 (two)  times daily. 30 tablet 0   Multiple Vitamins-Minerals (ZINC PO) Take 1 tablet by mouth daily at 6 (six) AM.     oxyCODONE-acetaminophen (PERCOCET/ROXICET) 5-325 MG tablet Take 1 tablet by mouth every 6 (six) hours as needed for moderate pain or severe pain.     tamsulosin (FLOMAX) 0.4 MG CAPS capsule Take 1 capsule (0.4 mg total) by mouth daily. 30 capsule 1   metFORMIN (GLUCOPHAGE) 500 MG tablet Take 500 mg by mouth every morning. (Patient not taking: Reported on 01/18/2023)     No current facility-administered medications for this visit.    Review of Systems  Constitutional:  Positive for fatigue. Negative for appetite change, chills, fever and unexpected weight change.  HENT:   Negative for hearing loss and voice change.   Eyes:  Negative for eye problems and icterus.  Respiratory:  Negative for chest tightness, cough and shortness of breath.   Cardiovascular:  Negative for chest pain and leg swelling.  Gastrointestinal:  Negative for abdominal distention and abdominal pain.       Umbilical discharge  Endocrine: Negative for hot flashes.  Genitourinary:  Negative for difficulty urinating, dysuria and frequency.   Musculoskeletal:  Negative for arthralgias.       Left hip pain  Skin:  Negative for itching and rash.  Neurological:  Negative for light-headedness and numbness.  Hematological:  Negative for adenopathy. Does not bruise/bleed easily.  Psychiatric/Behavioral:  Negative for confusion.      PHYSICAL EXAMINATION: ECOG PERFORMANCE STATUS: 1 - Symptomatic but completely ambulatory  Vitals:   02/01/23 1343  BP: 115/75  Pulse: 77  Resp: 18  Temp: (!) 97.1 F (36.2 C)   Filed Weights   02/01/23 1343  Weight: 147 lb 1.6 oz (66.7 kg)    Physical Exam Constitutional:      Appearance: He is not diaphoretic.  HENT:     Head: Normocephalic and atraumatic.  Eyes:     General: No scleral icterus. Cardiovascular:     Rate and Rhythm: Normal rate and regular rhythm.   Pulmonary:     Effort: Pulmonary effort is normal. No respiratory  distress.  Abdominal:     General: There is no distension.     Palpations: Abdomen is soft.     Tenderness: There is no abdominal tenderness.  Musculoskeletal:        General: Normal range of motion.     Cervical back: Normal range of motion and neck supple.  Skin:    General: Skin is warm and dry.     Findings: No erythema.  Neurological:     Mental Status: He is alert and oriented to person, place, and time. Mental status is at baseline.     Cranial Nerves: No cranial nerve deficit.     Motor: No abnormal muscle tone.  Psychiatric:        Mood and Affect: Affect normal.      LABORATORY DATA:  I have reviewed the data as listed    Latest Ref Rng & Units 01/18/2023    1:33 PM 12/21/2022    8:46 AM 12/14/2022    9:28 AM  CBC  WBC 4.0 - 10.5 K/uL 4.0  12.1  15.1   Hemoglobin 13.0 - 17.0 g/dL 16.1  09.6  04.5   Hematocrit 39.0 - 52.0 % 37.2  40.5  47.0   Platelets 150 - 400 K/uL 205  221  122       Latest Ref Rng & Units 02/01/2023    1:37 PM 01/18/2023    1:33 PM 01/04/2023   10:31 AM  CMP  Glucose 70 - 99 mg/dL  409    BUN 8 - 23 mg/dL  13    Creatinine 8.11 - 1.24 mg/dL  9.14    Sodium 782 - 956 mmol/L  136    Potassium 3.5 - 5.1 mmol/L  4.4    Chloride 98 - 111 mmol/L  102    CO2 22 - 32 mmol/L  27    Calcium 8.9 - 10.3 mg/dL  8.5    Total Protein 6.5 - 8.1 g/dL 6.6  6.2  6.2   Total Bilirubin 0.3 - 1.2 mg/dL 0.8  0.8  0.8   Alkaline Phos 38 - 126 U/L 173  193  135   AST 15 - 41 U/L 57  156  84   ALT 0 - 44 U/L 58  155  109      RADIOGRAPHIC STUDIES: I have personally reviewed the radiological images as listed and agreed with the findings in the report. US Venous Img Lower Bilateral  Result Date: 01/18/2023 CLINICAL DATA:  bilat leg swelling EXAM: BILATERAL LOWER EXTREMITY VENOUS DOPPLER ULTRASOUND TECHNIQUE: Gray-scale sonography with graded compression, as well as color Doppler and duplex  ultrasound were performed to evaluate the lower extremity deep venous systems from the level of the common femoral vein and including the common femoral, femoral, profunda femoral, popliteal and calf veins including the posterior tibial, peroneal and gastrocnemius veins when visible. The superficial great saphenous vein was also interrogated. Spectral Doppler was utilized to evaluate flow at rest and with distal augmentation maneuvers in the common femoral, femoral and popliteal veins. COMPARISON:  None Available. FINDINGS: RIGHT LOWER EXTREMITY Common Femoral Vein: No evidence of thrombus. Normal compressibility, respiratory phasicity and response to augmentation. Saphenofemoral Junction: No evidence of thrombus. Normal compressibility and flow on color Doppler imaging. Profunda Femoral Vein: No evidence of thrombus. Normal compressibility and flow on color Doppler imaging. Femoral Vein: No evidence of thrombus. Normal compressibility, respiratory phasicity and response to augmentation. Popliteal Vein: No evidence of thrombus. Normal compressibility, respiratory phasicity  and response to augmentation. Calf Veins: No evidence of thrombus. Normal compressibility and flow on color Doppler imaging. LEFT LOWER EXTREMITY Common Femoral Vein: No evidence of thrombus. Normal compressibility, respiratory phasicity and response to augmentation. Saphenofemoral Junction: No evidence of thrombus. Normal compressibility and flow on color Doppler imaging. Profunda Femoral Vein: No evidence of thrombus. Normal compressibility and flow on color Doppler imaging. Femoral Vein: No evidence of thrombus. Normal compressibility, respiratory phasicity and response to augmentation. Popliteal Vein: No evidence of thrombus. Normal compressibility, respiratory phasicity and response to augmentation. Calf Veins: No evidence of thrombus. Normal compressibility and flow on color Doppler imaging. IMPRESSION: No evidence of deep venous thrombosis  in either lower extremity. Electronically Signed   By: Feliberto Harts M.D.   On: 01/18/2023 16:49

## 2023-02-01 NOTE — Assessment & Plan Note (Signed)
Eligard 45 mg  01/18/23 - next due around 07/20/23

## 2023-02-03 ENCOUNTER — Ambulatory Visit: Payer: PPO

## 2023-02-07 ENCOUNTER — Other Ambulatory Visit: Payer: Self-pay | Admitting: Internal Medicine

## 2023-02-07 DIAGNOSIS — B182 Chronic viral hepatitis C: Secondary | ICD-10-CM

## 2023-02-08 ENCOUNTER — Inpatient Hospital Stay: Payer: PPO

## 2023-02-08 DIAGNOSIS — C61 Malignant neoplasm of prostate: Secondary | ICD-10-CM

## 2023-02-08 LAB — CBC WITH DIFFERENTIAL (CANCER CENTER ONLY)
Abs Immature Granulocytes: 0.04 10*3/uL (ref 0.00–0.07)
Basophils Absolute: 0 10*3/uL (ref 0.0–0.1)
Basophils Relative: 1 %
Eosinophils Absolute: 0.2 10*3/uL (ref 0.0–0.5)
Eosinophils Relative: 4 %
HCT: 36.4 % — ABNORMAL LOW (ref 39.0–52.0)
Hemoglobin: 11.9 g/dL — ABNORMAL LOW (ref 13.0–17.0)
Immature Granulocytes: 1 %
Lymphocytes Relative: 21 %
Lymphs Abs: 1.2 10*3/uL (ref 0.7–4.0)
MCH: 32.4 pg (ref 26.0–34.0)
MCHC: 32.7 g/dL (ref 30.0–36.0)
MCV: 99.2 fL (ref 80.0–100.0)
Monocytes Absolute: 0.7 10*3/uL (ref 0.1–1.0)
Monocytes Relative: 12 %
Neutro Abs: 3.4 10*3/uL (ref 1.7–7.7)
Neutrophils Relative %: 61 %
Platelet Count: 199 10*3/uL (ref 150–400)
RBC: 3.67 MIL/uL — ABNORMAL LOW (ref 4.22–5.81)
RDW: 15.5 % (ref 11.5–15.5)
Smear Review: NORMAL
WBC Count: 5.6 10*3/uL (ref 4.0–10.5)
nRBC: 0 % (ref 0.0–0.2)

## 2023-02-14 ENCOUNTER — Other Ambulatory Visit: Payer: Self-pay

## 2023-02-14 DIAGNOSIS — C61 Malignant neoplasm of prostate: Secondary | ICD-10-CM

## 2023-02-15 ENCOUNTER — Encounter: Payer: Self-pay | Admitting: Oncology

## 2023-02-15 ENCOUNTER — Inpatient Hospital Stay: Payer: PPO

## 2023-02-15 DIAGNOSIS — C61 Malignant neoplasm of prostate: Secondary | ICD-10-CM | POA: Diagnosis not present

## 2023-02-15 LAB — CMP (CANCER CENTER ONLY)
ALT: 88 U/L — ABNORMAL HIGH (ref 0–44)
AST: 70 U/L — ABNORMAL HIGH (ref 15–41)
Albumin: 2.6 g/dL — ABNORMAL LOW (ref 3.5–5.0)
Alkaline Phosphatase: 81 U/L (ref 38–126)
Anion gap: 7 (ref 5–15)
BUN: 17 mg/dL (ref 8–23)
CO2: 26 mmol/L (ref 22–32)
Calcium: 7.9 mg/dL — ABNORMAL LOW (ref 8.9–10.3)
Chloride: 104 mmol/L (ref 98–111)
Creatinine: 0.71 mg/dL (ref 0.61–1.24)
GFR, Estimated: >60 mL/min (ref >60)
Glucose, Bld: 124 mg/dL — ABNORMAL HIGH (ref 70–99)
Potassium: 3.5 mmol/L (ref 3.5–5.1)
Sodium: 137 mmol/L (ref 135–145)
Total Bilirubin: 1.1 mg/dL (ref 0.3–1.2)
Total Protein: 5.7 g/dL — ABNORMAL LOW (ref 6.5–8.1)

## 2023-02-16 ENCOUNTER — Other Ambulatory Visit: Payer: Self-pay

## 2023-02-16 ENCOUNTER — Ambulatory Visit
Admission: RE | Admit: 2023-02-16 | Discharge: 2023-02-16 | Disposition: A | Discharge status: Home / Self Care | Payer: PPO | Source: Ambulatory Visit | Attending: Internal Medicine | Admitting: Internal Medicine

## 2023-02-16 ENCOUNTER — Inpatient Hospital Stay: Payer: PPO | Admitting: Oncology

## 2023-02-16 ENCOUNTER — Inpatient Hospital Stay: Payer: PPO

## 2023-02-16 ENCOUNTER — Other Ambulatory Visit: Payer: Self-pay | Admitting: Lab

## 2023-02-16 ENCOUNTER — Encounter: Payer: Self-pay | Admitting: Oncology

## 2023-02-16 VITALS — BP 116/84 | HR 101 | Temp 96.4°F | Resp 18 | Wt 150.8 lb

## 2023-02-16 DIAGNOSIS — M7989 Other specified soft tissue disorders: Secondary | ICD-10-CM

## 2023-02-16 DIAGNOSIS — R197 Diarrhea, unspecified: Secondary | ICD-10-CM

## 2023-02-16 DIAGNOSIS — C61 Malignant neoplasm of prostate: Secondary | ICD-10-CM | POA: Diagnosis not present

## 2023-02-16 DIAGNOSIS — R21 Rash and other nonspecific skin eruption: Secondary | ICD-10-CM

## 2023-02-16 DIAGNOSIS — R7401 Elevation of levels of liver transaminase levels: Secondary | ICD-10-CM

## 2023-02-16 DIAGNOSIS — G893 Neoplasm related pain (acute) (chronic): Secondary | ICD-10-CM | POA: Diagnosis not present

## 2023-02-16 DIAGNOSIS — R600 Localized edema: Secondary | ICD-10-CM

## 2023-02-16 DIAGNOSIS — Z79818 Long term (current) use of other agents affecting estrogen receptors and estrogen levels: Secondary | ICD-10-CM

## 2023-02-16 DIAGNOSIS — B182 Chronic viral hepatitis C: Secondary | ICD-10-CM

## 2023-02-16 LAB — CMP (CANCER CENTER ONLY)
ALT: 130 U/L — ABNORMAL HIGH (ref 0–44)
AST: 111 U/L — ABNORMAL HIGH (ref 15–41)
Albumin: 2.6 g/dL — ABNORMAL LOW (ref 3.5–5.0)
Alkaline Phosphatase: 79 U/L (ref 38–126)
Anion gap: 9 (ref 5–15)
BUN: 15 mg/dL (ref 8–23)
CO2: 26 mmol/L (ref 22–32)
Calcium: 8.3 mg/dL — ABNORMAL LOW (ref 8.9–10.3)
Chloride: 104 mmol/L (ref 98–111)
Creatinine: 0.67 mg/dL (ref 0.61–1.24)
GFR, Estimated: >60 mL/min (ref >60)
Glucose, Bld: 153 mg/dL — ABNORMAL HIGH (ref 70–99)
Potassium: 3.5 mmol/L (ref 3.5–5.1)
Sodium: 139 mmol/L (ref 135–145)
Total Bilirubin: 1.3 mg/dL — ABNORMAL HIGH (ref 0.3–1.2)
Total Protein: 5.7 g/dL — ABNORMAL LOW (ref 6.5–8.1)

## 2023-02-16 LAB — CBC WITH DIFFERENTIAL (CANCER CENTER ONLY)
Abs Immature Granulocytes: 0.06 10*3/uL (ref 0.00–0.07)
Basophils Absolute: 0 10*3/uL (ref 0.0–0.1)
Basophils Relative: 0 %
Eosinophils Absolute: 0.4 10*3/uL (ref 0.0–0.5)
Eosinophils Relative: 7 %
HCT: 37.1 % — ABNORMAL LOW (ref 39.0–52.0)
Hemoglobin: 12.3 g/dL — ABNORMAL LOW (ref 13.0–17.0)
Immature Granulocytes: 1 %
Lymphocytes Relative: 20 %
Lymphs Abs: 1.3 10*3/uL (ref 0.7–4.0)
MCH: 31.9 pg (ref 26.0–34.0)
MCHC: 33.2 g/dL (ref 30.0–36.0)
MCV: 96.1 fL (ref 80.0–100.0)
Monocytes Absolute: 0.7 10*3/uL (ref 0.1–1.0)
Monocytes Relative: 12 %
Neutro Abs: 3.8 10*3/uL (ref 1.7–7.7)
Neutrophils Relative %: 60 %
Platelet Count: 278 10*3/uL (ref 150–400)
RBC: 3.86 MIL/uL — ABNORMAL LOW (ref 4.22–5.81)
RDW: 14.1 % (ref 11.5–15.5)
WBC Count: 6.3 10*3/uL (ref 4.0–10.5)
nRBC: 0 % (ref 0.0–0.2)

## 2023-02-16 LAB — GASTROINTESTINAL PANEL BY PCR, STOOL (REPLACES STOOL CULTURE)

## 2023-02-16 LAB — C DIFFICILE QUICK SCREEN W PCR REFLEX
C Diff antigen: NEGATIVE
C Diff interpretation: NOT DETECTED
C Diff toxin: NEGATIVE

## 2023-02-16 LAB — MAGNESIUM: Magnesium: 1.7 mg/dL (ref 1.7–2.4)

## 2023-02-16 MED ORDER — DIPHENOXYLATE-ATROPINE 2.5-0.025 MG PO TABS: 1.0000 | ORAL_TABLET | Freq: Four times a day (QID) | ORAL | 0 refills | Status: DC | PRN

## 2023-02-16 MED ORDER — FUROSEMIDE 20 MG PO TABS: 20.0000 mg | ORAL_TABLET | Freq: Every day | ORAL | 1 refills | Status: DC | PRN

## 2023-02-16 NOTE — Assessment & Plan Note (Signed)
Percocet 5/325 5 mg every 6 hours as needed for pain  

## 2023-02-16 NOTE — Assessment & Plan Note (Signed)
Due to hepatitis C and chemotherapy. Worse while on Xtandi. Hold off Xtandi Continue follow-up with GI

## 2023-02-16 NOTE — Assessment & Plan Note (Addendum)
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 [employer], high-volume disease. Labs are reviewed and discussed with patient. Patient has persistent transaminitis secondary to HCV Xtandi 160 mg daily  on hold due to transaminitis Can not tolerate 120mg  daily. Hold off I recommend to continue ADT, with no rush into adding other ARPI, will wait until his other medical problems are stabilized.

## 2023-02-16 NOTE — Progress Notes (Signed)
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 [employer], high-volume disease. Labs are reviewed and discussed with patient. Patient has persistent transaminitis secondary to HCV Xtandi 160 mg daily  on hold due to transaminitis Can not tolerate 120mg  daily. Hold off I recommend to continue ADT, with no rush into adding other ARPI, will wait until his other medical problems are stabilized.     Neoplasm related pain Percocet 5/325 5 mg every 6 hours as needed for pain   Transaminitis Due to hepatitis C and chemotherapy. Worse while on Xtandi. Hold off Xtandi Continue follow-up with GI  HCV (hepatitis C virus) Follow-up with gastroenterology for hepatitis C treatments.  Androgen deprivation therapy Eligard 45 mg  01/18/23 - next due around 07/20/23   Leg edema Check ultrasound lower extremity to rule out DVT.-Studies negative. Recommend Lasix 20 mg daily as needed edema. Check echo.  Possibly due to portal hypertension.   Skin rash Recommend topical otc hydrocortisone cream.     Orders Placed This Encounter  Procedures   C difficile quick screen w PCR reflex    Standing Status:   Future    Standing Expiration Date:   02/16/2024   Gastrointestinal Panel by PCR , Stool    Standing Status:   Future    Standing Expiration Date:   02/16/2024   CBC (Cancer Center Only)    Standing Status:   Future    Standing Expiration Date:   02/16/2024   CMP (Cancer Center only)    Standing Status:   Future    Standing Expiration  Date:   02/16/2024   PSA    Standing Status:   Future    Standing Expiration Date:   02/16/2024   ECHOCARDIOGRAM COMPLETE    Standing Status:   Future    Standing Expiration Date:   02/16/2024    Order Specific Question:   Where should this test be performed    Answer:   [facility]    Order Specific Question:   Perflutren DEFINITY (image enhancing agent) should be administered unless hypersensitivity or allergy exist    Answer:   Administer Perflutren    Order Specific Question:   Reason for exam-Echo    Answer:   Other-Full Diagnosis List    Order Specific Question:   Full ICD-10/Reason for Exam    Answer:   Leg swelling [[id number]]   Follow-up in 3 weeks.  We spent sufficient time to discuss many aspect of care, questions were answered to patient's satisfaction.  [name], MD, PhD [facility] Hematology Oncology 02/16/2023    HISTORY OF PRESENTING ILLNESS:  Nicolas Gonzales 72 y.o. male presents to establish care for prostate cancer I have reviewed his chart and materials related to his cancer extensively and collaborated history with the patient. Summary of oncologic history is as follows: Oncology History  Prostate cancer (HCC)  10/11/2022 Initial Diagnosis   Prostate cancer  Patient was noted to have a PSA of 44.  Patient had urology workup and status post prostate biopsy. 10/11/2022, prostate biopsy  showed [A] PROSTATE, LEFT BASE:   ACINAR ADENOCARCINOMA, GLEASON 3+4=7  (GG2), INVOLVING 1 OF 1 CORES, MEASURING 14  MM ( 93%). 30% GLEASON PATTERN 4; PERINEURAL INVASION PRESENT  [B] PROSTATE, LEFT MID:   ACINAR ADENOCARCINOMA, GLEASON 3+4=7  (GG 2),INVOLVING 2 OF 2 CORES, MEASURING 26  MM ( 96%). 20% GLEASON PATTERN 4;CRIBRIFORM PATTERN 4 AND PERINEURAL INVASION PRESENT  [C] PROSTATE, LEFT APEX:   ACINAR ADENOCARCINOMA, GLEASON 3+4=7  (GG2), INVOLVING 2 OF 3 CORES, MEASURING 17  MM ( 77%). 20% GLEASON PATTERN 4; CRIBRIFORM PATTERN 4 AND PERINEURAL INVASION PRESENT  [D] PROSTATE,  RIGHT BASE:   ACINAR ADENOCARCINOMA, GLEASON 4+4=8  (GG4), INVOLVING 3 OF 3 CORES, MEASURING 15  MM ( 68%). CRIBRIFORM PATTERN 4 AND PERINEURAL INVASION PRESENT  [E] PROSTATE, RIGHT MID:   ACINAR ADENOCARCINOMA, GLEASON 4+3=7  (GG3), INVOLVING 1 OF 3 CORES, MEASURING 12  MM ( 50%). 80% GLEASON PATTERN 4; CRIBRIFORM PATTERN 4 PRESENT  [F] PROSTATE, RIGHT APEX:   ACINAR ADENOCARCINOMA, GLEASON 4+3=7  (GG3), INVOLVING 4 OF 5 CORES, MEASURING 23  MM ( 70%). 80% GLEASON PATTERN 4; TERTIARY PATTERN 5; CRIBRIFORM PATTERN 4 AND PNI PRESEN      11/09/2022 Imaging   PMSA PET scan showed 1. Locally advanced prostate primary with nodal metastasis in the chest, abdomen, and pelvis. 2. Diffuse, large volume osseous metastasis. 3. Incidental findings, including: Coronary artery atherosclerosis. Aortic Atherosclerosis (ICD10-I70.0). Possible constipation.   11/18/2022 Cancer Staging   Staging form: Prostate, AJCC 8th Edition - Clinical stage from 11/18/2022: Stage IVB (cTX, cM1, PSA: 44, Grade Group: 4) - Signed by [name], MD on 11/18/2022 Stage prefix: Initial diagnosis Prostate specific antigen (PSA) range: 20 or greater Gleason score: 8 Histologic grading system: 5 grade system   11/30/2022 - 12/02/2022 Chemotherapy   Patient is on Treatment Plan : PROSTATE Docetaxel (75) q21d      Patient reports left hip pain radiating down to left lower extremity. His primary care provider prescribed oxycodone/acetaminophen 5/325 mg every 6 hours as needed.  He has not tried due to concern of side effects. Family history positive for brother with prostate cancer.  Today he was accompanied by 2 of his sons. Patient is active for his age.  He goes to gym 1-2 times per week.   INTERVAL HISTORY Nicolas Gonzales is a 72 y.o. male who has above history reviewed by me today presents for follow up visit for prostate cancer management.  Appetite is good. He resumed Xtandi 120mg  daily 2 weeks ago, start to experience diarrhea,  multiple loose BM. He takes imodium with no relief.  Lower extremity swelling, improved with lasix, he ran out of lasix and swelling is worse. He has noticed rash on bilateral thigh, just above knees. Denies any new medication today.  [place] elstrography today showed findings suggesting portal hypertension.   MEDICAL HISTORY:  Past Medical History:  Diagnosis Date   BPH (benign prostatic hyperplasia)    Diabetes mellitus without complication (HCC)    Elevated PSA    Hypertension     SURGICAL HISTORY: Past Surgical History:  Procedure Laterality Date   NO PAST SURGERIES     PROSTATE BIOPSY N/A 10/11/2022   Procedure: PROSTATE BIOPSY;  Surgeon: Riki Altes, MD;  Location: [facility] ORS;  Service: Urology;  Laterality: N/A;   TRANSRECTAL ULTRASOUND N/A 10/11/2022   Procedure: TRANSRECTAL ULTRASOUND;  Surgeon: Riki Altes, MD;  Location: [facility] ORS;  Service: Urology;  Laterality: N/A;    SOCIAL  HISTORY: Social History   Socioeconomic History   Marital status: Married    Spouse name: Not on file   Number of children: Not on file   Years of education: Not on file   Highest education level: Not on file  Occupational History   Not on file  Tobacco Use   Smoking status: Never   Smokeless tobacco: Never  Vaping Use   Vaping status: Not on file  Substance and Sexual Activity   Alcohol use: Never   Drug use: Never   Sexual activity: Not on file  Other Topics Concern   Not on file  Social History Narrative   Not on file   Social Determinants of Health   Financial Resource Strain: Not on file  Food Insecurity: No Food Insecurity (11/18/2022)   Hunger Vital Sign    Worried About Running Out of Food in the Last Year: Never true    Ran Out of Food in the Last Year: Never true  Transportation Needs: No Transportation Needs (11/18/2022)   PRAPARE - Administrator, Civil Service (Medical): No    Lack of Transportation (Non-Medical): No  Physical Activity: Not on file   Stress: Not on file  Social Connections: Not on file  Intimate Partner Violence: Not At Risk (11/18/2022)   Humiliation, Afraid, Rape, and Kick questionnaire    Fear of Current or Ex-Partner: No    Emotionally Abused: No    Physically Abused: No    Sexually Abused: No    FAMILY HISTORY: Family History  Problem Relation Age of Onset   Breast cancer Mother    Alzheimer's disease Father    Prostate cancer Brother     ALLERGIES:  has No Known Allergies.  MEDICATIONS:  Current Outpatient Medications  Medication Sig Dispense Refill   Cholecalciferol (VITAMIN D-1000 MAX ST) 25 MCG (1000 UT) tablet Take 1,000 Units by mouth daily.     CINNAMON PO Take 1 tablet by mouth daily at 6 (six) AM.     COD LIVER OIL PO Take 1 tablet by mouth daily at 6 (six) AM.     cyanocobalamin (VITAMIN B12) 500 MCG tablet Take 1 tablet by mouth daily.     diphenoxylate-atropine (LOMOTIL) 2.5-0.025 MG tablet Take 1 tablet by mouth 4 (four) times daily as needed for diarrhea or loose stools. 60 tablet 0   enzalutamide (XTANDI) 40 MG capsule Take 3 capsules (120 mg total) by mouth daily. 90 capsule 0   FIBER PO Take 1 tablet by mouth daily at 6 (six) AM.     megestrol (MEGACE) 20 MG tablet Take 1 tablet (20 mg total) by mouth 2 (two) times daily. 30 tablet 0   Multiple Vitamins-Minerals (ZINC PO) Take 1 tablet by mouth daily at 6 (six) AM.     oxyCODONE-acetaminophen (PERCOCET/ROXICET) 5-325 MG tablet Take 1 tablet by mouth every 6 (six) hours as needed for moderate pain or severe pain.     tamsulosin (FLOMAX) 0.4 MG CAPS capsule Take 1 capsule (0.4 mg total) by mouth daily. 30 capsule 1   furosemide (LASIX) 20 MG tablet Take 1 tablet (20 mg total) by mouth daily as needed. 30 tablet 1   metFORMIN (GLUCOPHAGE) 500 MG tablet Take 500 mg by mouth every morning. (Patient not taking: Reported on 01/18/2023)     No current facility-administered medications for this visit.    Review of Systems  Constitutional:   Positive for fatigue. Negative for appetite change, chills, fever and unexpected weight  change.  HENT:   Negative for hearing loss and voice change.   Eyes:  Negative for eye problems and icterus.  Respiratory:  Negative for chest tightness, cough and shortness of breath.   Cardiovascular:  Positive for leg swelling. Negative for chest pain.  Gastrointestinal:  Positive for diarrhea. Negative for abdominal distention and abdominal pain.  Endocrine: Negative for hot flashes.  Genitourinary:  Negative for difficulty urinating, dysuria and frequency.   Musculoskeletal:  Negative for arthralgias.       Left hip pain  Skin:  Positive for rash. Negative for itching.  Neurological:  Negative for light-headedness and numbness.  Hematological:  Negative for adenopathy. Does not bruise/bleed easily.  Psychiatric/Behavioral:  Negative for confusion.      PHYSICAL EXAMINATION: ECOG PERFORMANCE STATUS: 1 - Symptomatic but completely ambulatory  Vitals:   02/16/23 1445  BP: 116/84  Pulse: (!) 101  Resp: 18  Temp: (!) 96.4 F (35.8 C)  SpO2: 99%   Filed Weights   02/16/23 1445  Weight: 150 lb 12.8 oz (68.4 kg)    Physical Exam Constitutional:      Appearance: He is not diaphoretic.  HENT:     Head: Normocephalic and atraumatic.  Eyes:     General: No scleral icterus. Cardiovascular:     Rate and Rhythm: Normal rate and regular rhythm.  Pulmonary:     Effort: Pulmonary effort is normal. No respiratory distress.  Abdominal:     General: There is no distension.     Palpations: Abdomen is soft.     Tenderness: There is no abdominal tenderness.  Musculoskeletal:        General: Normal range of motion.     Cervical back: Normal range of motion and neck supple.     Right lower leg: Edema present.     Left lower leg: Edema present.  Skin:    General: Skin is warm and dry.     Findings: Rash present. No erythema.  Neurological:     Mental Status: He is alert and oriented to person,  place, and time. Mental status is at baseline.     Cranial Nerves: No cranial nerve deficit.     Motor: No abnormal muscle tone.  Psychiatric:        Mood and Affect: Affect normal.      LABORATORY DATA:  I have reviewed the data as listed    Latest Ref Rng & Units 02/16/2023    2:30 PM 02/08/2023    9:20 AM 01/18/2023    1:33 PM  CBC  WBC 4.0 - 10.5 K/uL 6.3  5.6  4.0   Hemoglobin 13.0 - 17.0 g/dL 84.1  32.4  40.1   Hematocrit 39.0 - 52.0 % 37.1  36.4  37.2   Platelets 150 - 400 K/uL 278  199  205       Latest Ref Rng & Units 02/16/2023    2:30 PM 02/15/2023    8:55 AM 02/01/2023    1:37 PM  CMP  Glucose 70 - 99 mg/dL 027  253    BUN 8 - 23 mg/dL 15  17    Creatinine 6.64 - 1.24 mg/dL 4.03  4.74    Sodium 259 - 145 mmol/L 139  137    Potassium 3.5 - 5.1 mmol/L 3.5  3.5    Chloride 98 - 111 mmol/L 104  104    CO2 22 - 32 mmol/L 26  26    Calcium 8.9 - 10.3 mg/dL  8.3  7.9    Total Protein 6.5 - 8.1 g/dL 5.7  5.7  6.6   Total Bilirubin 0.3 - 1.2 mg/dL 1.3  1.1  0.8   Alkaline Phos 38 - 126 U/L 79  81  173   AST 15 - 41 U/L 111  70  57   ALT 0 - 44 U/L 130  88  58      RADIOGRAPHIC STUDIES: I have personally reviewed the radiological images as listed and agreed with the findings in the report. [place] ELASTOGRAPHY LIVER  Result Date: 02/16/2023 CLINICAL DATA:  Hepatitis-C EXAM: [place] ELASTOGRAPHY HEPATIC TECHNIQUE: Sonography of the liver was performed. In addition, ultrasound elastography evaluation of the liver was performed. A region of interest was placed within the right lobe of the liver. Following application of a compressive sonographic pulse, tissue compressibility was assessed. Multiple assessments were performed at the selected site. Median tissue compressibility was determined. Previously, hepatic stiffness was assessed by shear wave velocity. Based on recently published Society of Radiologists in Ultrasound consensus article, reporting is now recommended to be performed in  the SI units of pressure (kiloPascals) representing hepatic stiffness/elasticity. The obtained result is compared to the published reference standards. (cACLD = compensated Advanced Chronic Liver Disease) COMPARISON:  Ultrasound abdomen dated 12/22/2022 FINDINGS: Liver: No focal lesion identified. Within normal limits in parenchymal echogenicity. Portal vein is patent on color Doppler imaging with normal direction of blood flow towards the liver. Trace perihepatic free fluid. Incidentally noted echogenicities within the gallbladder, likely sludge. ULTRASOUND HEPATIC ELASTOGRAPHY Device: Siemens Helix VTQ Patient position: Oblique Transducer: 6C1 Number of measurements: 10 Hepatic segment:  8 Median kPa: 24.9 IQR: 3.2 IQR/Median kPa ratio: 0.1 Data quality:  Good Diagnostic category: > or =17 kPa: highly suggestive of cACLD with an increased probability of clinically significant portal hypertension The use of hepatic elastography is applicable to patients with viral hepatitis and non-alcoholic fatty liver disease. At this time, there is insufficient data for the referenced cut-off values and use in other causes of liver disease, including alcoholic liver disease. Patients, however, may be assessed by elastography and serve as their own reference standard/baseline. In patients with non-alcoholic liver disease, the values suggesting compensated advanced chronic liver disease (cACLD) may be lower, and patients may need additional testing with elasticity results of 7-9 kPa. Please note that abnormal hepatic elasticity and shear wave velocities may also be identified in clinical settings other than with hepatic fibrosis, such as: acute hepatitis, elevated right heart and central venous pressures including use of beta blockers, veno-occlusive disease (Budd-Chiari), infiltrative processes such as mastocytosis/amyloidosis/infiltrative tumor/lymphoma, extrahepatic cholestasis, with hyperemia in the post-prandial state, and  with liver transplantation. Correlation with patient history, laboratory data, and clinical condition recommended. Diagnostic Categories: < or =5 kPa: high probability of being normal < or =9 kPa: in the absence of other known clinical signs, rules out cACLD >9 kPa and ?13 kPa: suggestive of cACLD, but needs further testing >13 kPa: highly suggestive of cACLD > or =17 kPa: highly suggestive of cACLD with an increased probability of clinically significant portal hypertension IMPRESSION: ULTRASOUND LIVER: 1. No focal hepatic lesions. 2. Trace perihepatic ascites. 3. Incidentally noted gallbladder sludge. ULTRASOUND HEPATIC ELASTOGRAPHY: Median kPa:  24.9 Diagnostic category: > or =17 kPa: highly suggestive of cACLD with an increased probability of clinically significant portal hypertension Electronically Signed   By: Agustin Cree M.D.   On: 02/16/2023 09:01   US Venous Img Lower Bilateral  Result Date: 01/18/2023 CLINICAL DATA:  bilat leg swelling EXAM: BILATERAL LOWER EXTREMITY VENOUS DOPPLER ULTRASOUND TECHNIQUE: Gray-scale sonography with graded compression, as well as color Doppler and duplex ultrasound were performed to evaluate the lower extremity deep venous systems from the level of the common femoral vein and including the common femoral, femoral, profunda femoral, popliteal and calf veins including the posterior tibial, peroneal and gastrocnemius veins when visible. The superficial great saphenous vein was also interrogated. Spectral Doppler was utilized to evaluate flow at rest and with distal augmentation maneuvers in the common femoral, femoral and popliteal veins. COMPARISON:  None Available. FINDINGS: RIGHT LOWER EXTREMITY Common Femoral Vein: No evidence of thrombus. Normal compressibility, respiratory phasicity and response to augmentation. Saphenofemoral Junction: No evidence of thrombus. Normal compressibility and flow on color Doppler imaging. Profunda Femoral Vein: No evidence of thrombus. Normal  compressibility and flow on color Doppler imaging. Femoral Vein: No evidence of thrombus. Normal compressibility, respiratory phasicity and response to augmentation. Popliteal Vein: No evidence of thrombus. Normal compressibility, respiratory phasicity and response to augmentation. Calf Veins: No evidence of thrombus. Normal compressibility and flow on color Doppler imaging. LEFT LOWER EXTREMITY Common Femoral Vein: No evidence of thrombus. Normal compressibility, respiratory phasicity and response to augmentation. Saphenofemoral Junction: No evidence of thrombus. Normal compressibility and flow on color Doppler imaging. Profunda Femoral Vein: No evidence of thrombus. Normal compressibility and flow on color Doppler imaging. Femoral Vein: No evidence of thrombus. Normal compressibility, respiratory phasicity and response to augmentation. Popliteal Vein: No evidence of thrombus. Normal compressibility, respiratory phasicity and response to augmentation. Calf Veins: No evidence of thrombus. Normal compressibility and flow on color Doppler imaging. IMPRESSION: No evidence of deep venous thrombosis in either lower extremity. Electronically Signed   By: Feliberto Harts M.D.   On: 01/18/2023 16:49

## 2023-02-16 NOTE — Assessment & Plan Note (Signed)
Follow-up with gastroenterology for hepatitis C treatments.

## 2023-02-16 NOTE — Assessment & Plan Note (Signed)
Recommend topical otc hydrocortisone cream.

## 2023-02-16 NOTE — Assessment & Plan Note (Signed)
Eligard 45 mg  01/18/23 - next due around 07/20/23

## 2023-02-16 NOTE — Assessment & Plan Note (Signed)
Check ultrasound lower extremity to rule out DVT.-Studies negative. Recommend Lasix 20 mg daily as needed edema. Check echo.  Possibly due to portal hypertension.

## 2023-02-22 ENCOUNTER — Inpatient Hospital Stay: Payer: PPO | Admitting: Oncology

## 2023-02-22 ENCOUNTER — Inpatient Hospital Stay: Payer: PPO

## 2023-03-03 ENCOUNTER — Ambulatory Visit
Admission: RE | Admit: 2023-03-03 | Discharge: 2023-03-03 | Disposition: A | Discharge status: Home / Self Care | Payer: PPO | Source: Ambulatory Visit | Attending: Oncology | Admitting: Oncology

## 2023-03-03 DIAGNOSIS — R600 Localized edema: Secondary | ICD-10-CM

## 2023-03-03 DIAGNOSIS — I083 Combined rheumatic disorders of mitral, aortic and tricuspid valves: Secondary | ICD-10-CM | POA: Diagnosis not present

## 2023-03-03 DIAGNOSIS — M7989 Other specified soft tissue disorders: Secondary | ICD-10-CM | POA: Insufficient documentation

## 2023-03-03 LAB — ECHOCARDIOGRAM COMPLETE
AR max vel: 2.51 cm2
AV Area VTI: 2.84 cm2
AV Area mean vel: 2.47 cm2
AV Mean grad: 3 mmHg
AV Peak grad: 4.8 mmHg
Ao pk vel: 1.09 m/s
Area-P 1/2: 4.19 cm2
MV VTI: 1.79 cm2
S' Lateral: 2.5 cm

## 2023-03-03 NOTE — Progress Notes (Signed)
*  PRELIMINARY RESULTS* Echocardiogram 2D Echocardiogram has been performed.  Nicolas Gonzales 03/03/2023, 10:48 AM

## 2023-03-08 ENCOUNTER — Ambulatory Visit
Admission: RE | Admit: 2023-03-08 | Discharge: 2023-03-08 | Disposition: A | Discharge status: Home / Self Care | Payer: PPO | Source: Ambulatory Visit | Attending: Family Medicine | Admitting: Family Medicine

## 2023-03-08 ENCOUNTER — Other Ambulatory Visit: Payer: Self-pay | Admitting: Family Medicine

## 2023-03-08 DIAGNOSIS — C7951 Secondary malignant neoplasm of bone: Secondary | ICD-10-CM | POA: Insufficient documentation

## 2023-03-08 DIAGNOSIS — C61 Malignant neoplasm of prostate: Secondary | ICD-10-CM | POA: Insufficient documentation

## 2023-03-08 DIAGNOSIS — R600 Localized edema: Secondary | ICD-10-CM

## 2023-03-09 ENCOUNTER — Inpatient Hospital Stay (HOSPITAL_BASED_OUTPATIENT_CLINIC_OR_DEPARTMENT_OTHER): Payer: PPO | Admitting: Oncology

## 2023-03-09 ENCOUNTER — Inpatient Hospital Stay: Payer: PPO | Attending: Oncology

## 2023-03-09 ENCOUNTER — Inpatient Hospital Stay: Payer: PPO

## 2023-03-09 ENCOUNTER — Encounter: Payer: Self-pay | Admitting: Oncology

## 2023-03-09 VITALS — BP 128/81 | HR 74 | Temp 96.0°F | Resp 18 | Wt 176.8 lb

## 2023-03-09 DIAGNOSIS — R7401 Elevation of levels of liver transaminase levels: Secondary | ICD-10-CM | POA: Diagnosis not present

## 2023-03-09 DIAGNOSIS — Z79899 Other long term (current) drug therapy: Secondary | ICD-10-CM | POA: Insufficient documentation

## 2023-03-09 DIAGNOSIS — B1920 Unspecified viral hepatitis C without hepatic coma: Secondary | ICD-10-CM | POA: Diagnosis not present

## 2023-03-09 DIAGNOSIS — C7951 Secondary malignant neoplasm of bone: Secondary | ICD-10-CM | POA: Insufficient documentation

## 2023-03-09 DIAGNOSIS — K7460 Unspecified cirrhosis of liver: Secondary | ICD-10-CM | POA: Insufficient documentation

## 2023-03-09 DIAGNOSIS — C61 Malignant neoplasm of prostate: Secondary | ICD-10-CM | POA: Insufficient documentation

## 2023-03-09 DIAGNOSIS — G893 Neoplasm related pain (acute) (chronic): Secondary | ICD-10-CM

## 2023-03-09 DIAGNOSIS — Z79818 Long term (current) use of other agents affecting estrogen receptors and estrogen levels: Secondary | ICD-10-CM

## 2023-03-09 DIAGNOSIS — B182 Chronic viral hepatitis C: Secondary | ICD-10-CM

## 2023-03-09 DIAGNOSIS — R600 Localized edema: Secondary | ICD-10-CM | POA: Diagnosis not present

## 2023-03-09 LAB — CBC (CANCER CENTER ONLY)
HCT: 37.1 % — ABNORMAL LOW (ref 39.0–52.0)
Hemoglobin: 12.3 g/dL — ABNORMAL LOW (ref 13.0–17.0)
MCH: 32.9 pg (ref 26.0–34.0)
MCHC: 33.2 g/dL (ref 30.0–36.0)
MCV: 99.2 fL (ref 80.0–100.0)
Platelet Count: 301 10*3/uL (ref 150–400)
RBC: 3.74 MIL/uL — ABNORMAL LOW (ref 4.22–5.81)
RDW: 15 % (ref 11.5–15.5)
WBC Count: 6.1 10*3/uL (ref 4.0–10.5)
nRBC: 0.3 % — ABNORMAL HIGH (ref 0.0–0.2)

## 2023-03-09 LAB — CMP (CANCER CENTER ONLY)
ALT: 145 U/L — ABNORMAL HIGH (ref 0–44)
AST: 108 U/L — ABNORMAL HIGH (ref 15–41)
Albumin: 2.5 g/dL — ABNORMAL LOW (ref 3.5–5.0)
Alkaline Phosphatase: 182 U/L — ABNORMAL HIGH (ref 38–126)
Anion gap: 5 (ref 5–15)
BUN: 11 mg/dL (ref 8–23)
CO2: 31 mmol/L (ref 22–32)
Calcium: 8.4 mg/dL — ABNORMAL LOW (ref 8.9–10.3)
Chloride: 99 mmol/L (ref 98–111)
Creatinine: 0.81 mg/dL (ref 0.61–1.24)
GFR, Estimated: >60 mL/min (ref >60)
Glucose, Bld: 148 mg/dL — ABNORMAL HIGH (ref 70–99)
Potassium: 4.4 mmol/L (ref 3.5–5.1)
Sodium: 135 mmol/L (ref 135–145)
Total Bilirubin: 1.6 mg/dL — ABNORMAL HIGH (ref 0.3–1.2)
Total Protein: 6.1 g/dL — ABNORMAL LOW (ref 6.5–8.1)

## 2023-03-09 LAB — PSA: Prostatic Specific Antigen: 15.83 ng/mL — ABNORMAL HIGH (ref 0.00–4.00)

## 2023-03-09 NOTE — Assessment & Plan Note (Addendum)
Due to hepatitis C and liver cirrhosis Continue follow-up with GI

## 2023-03-09 NOTE — Assessment & Plan Note (Addendum)
On Tramadol as needed for pain -

## 2023-03-09 NOTE — Progress Notes (Signed)
Nutrition Follow-up:   Patient with stage IV prostate cancer.  Patient xtandi on hold due to transaminitis because of hepatitis C.    Met with patient and son.  Patient reports that appetite is better.  Taste of food is still off.  Ate eggs, sausage, coffee and juice this am for breakfast.  Ate salmon, mashed potatoes and salad for dinner last night.  Drinks boost shakes at times but drinking less with eating more solid foods.    Waiting on medication approval for treatment of hepatitis C  Medications: reviewed  Labs: reviewed  Anthropometrics:   Weight 176 lb 12.8 oz (bilateral edema) Fluid weight gain 140 lb on 6/19 130 lb on 5/22 122 lb on 5/10 131 lb on 5/1   NUTRITION DIAGNOSIS: Inadequate oral intake continues   INTERVENTION:  Continue well balanced diet including good sources of protein.   Continue oral nutrition supplements Cautioned patient on excessive sodium intake with increased edema.      MONITORING, EVALUATION, GOAL: weight trends, intake   NEXT VISIT: Thursday, Nov 14 after MD visit   B. Freida Busman, RD, LDN Registered Dietitian [phone]

## 2023-03-09 NOTE — Assessment & Plan Note (Signed)
Eligard 45 mg  01/18/23 - next due around 07/20/23

## 2023-03-09 NOTE — Assessment & Plan Note (Addendum)
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 [employer], high-volume disease. Not able to tolerate Docetaxel. Patient has persistent transaminitis secondary to HCV Xtandi 160 mg daily  on hold due to transaminitis Can not tolerate 120mg  daily. Hold off PSA has trended down, today's level is pending.  I recommend to continue ADT as monotherapy for now Repeat PSMA PET in 3 months. Could add ARPI, after his other medical problems are stabilized.

## 2023-03-09 NOTE — Progress Notes (Signed)
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 [employer], high-volume disease. Not able to tolerate Docetaxel. Patient has persistent transaminitis secondary to HCV Xtandi 160 mg daily  on hold due to transaminitis Can not tolerate 120mg  daily. Hold off PSA has trended down, today's level is pending.  I recommend to continue ADT as monotherapy for now Repeat PSMA PET in 3 months. Could add ARPI, after his other medical problems are stabilized.     Neoplasm related pain On Tramadol as needed for pain -    Transaminitis Due to hepatitis C and liver cirrhosis Continue follow-up with GI  Androgen deprivation therapy Eligard 45 mg  01/18/23 - next due around 07/20/23   HCV (hepatitis C virus) Follow-up with gastroenterology for hepatitis C treatments.  Leg edema Check ultrasound lower extremity to rule out DVT.-Studies negative. Echo showed normal LVEF Possibly due to portal hypertension.  Follow up with PCP and GI for further evaluation.  Continue diuretics.     Orders Placed This Encounter  Procedures   NM PET (PSMA) SKULL TO MID THIGH    Standing Status:   Future    Standing Expiration Date:   03/08/2024    Order Specific Question:   If indicated for the ordered procedure, I authorize the administration of a radiopharmaceutical per Radiology protocol    Answer:   Yes    Order Specific Question:   Preferred imaging location?    Answer:   [facility]   CBC with Differential (Cancer Center Only)    Standing  Status:   Future    Standing Expiration Date:   03/08/2024   CMP (Cancer Center only)    Standing Status:   Future    Standing Expiration Date:   03/08/2024   PSA    Standing Status:   Future    Standing Expiration Date:   03/08/2024   Follow-up in 3 months.  We spent sufficient time to discuss many aspect of care, questions were answered to patient's satisfaction.  [name], MD, PhD [facility] Hematology Oncology 03/09/2023    HISTORY OF PRESENTING ILLNESS:  Nicolas Gonzales 72 y.o. male presents to establish care for prostate cancer I have reviewed his chart and materials related to his cancer extensively and collaborated history with the patient. Summary of oncologic history is as follows: Oncology History  Prostate cancer (HCC)  10/11/2022 Initial Diagnosis   Prostate cancer  Patient was noted to have a PSA of 44.  Patient had urology workup and status post prostate biopsy. 10/11/2022, prostate biopsy showed [A] PROSTATE, LEFT BASE:   ACINAR ADENOCARCINOMA, GLEASON 3+4=7  (GG2), INVOLVING 1 OF 1 CORES, MEASURING 14  MM ( 93%). 30% GLEASON PATTERN 4; PERINEURAL INVASION PRESENT  [B] PROSTATE, LEFT MID:   ACINAR ADENOCARCINOMA, GLEASON 3+4=7  (GG 2),INVOLVING 2 OF 2 CORES, MEASURING 26  MM ( 96%). 20% GLEASON PATTERN 4;CRIBRIFORM PATTERN 4 AND PERINEURAL INVASION PRESENT  [C] PROSTATE, LEFT APEX:   ACINAR ADENOCARCINOMA, GLEASON 3+4=7  (GG2), INVOLVING 2 OF 3 CORES, MEASURING 17  MM (  77%). 20% GLEASON PATTERN 4; CRIBRIFORM PATTERN 4 AND PERINEURAL INVASION PRESENT  [D] PROSTATE, RIGHT BASE:   ACINAR ADENOCARCINOMA, GLEASON 4+4=8  (GG4), INVOLVING 3 OF 3 CORES, MEASURING 15  MM ( 68%). CRIBRIFORM PATTERN 4 AND PERINEURAL INVASION PRESENT  [E] PROSTATE, RIGHT MID:   ACINAR ADENOCARCINOMA, GLEASON 4+3=7  (GG3), INVOLVING 1 OF 3 CORES, MEASURING 12  MM ( 50%). 80% GLEASON PATTERN 4; CRIBRIFORM PATTERN 4 PRESENT  [F] PROSTATE, RIGHT APEX:   ACINAR ADENOCARCINOMA, GLEASON 4+3=7  (GG3), INVOLVING  4 OF 5 CORES, MEASURING 23  MM ( 70%). 80% GLEASON PATTERN 4; TERTIARY PATTERN 5; CRIBRIFORM PATTERN 4 AND PNI PRESEN      11/09/2022 Imaging   PMSA PET scan showed 1. Locally advanced prostate primary with nodal metastasis in the chest, abdomen, and pelvis. 2. Diffuse, large volume osseous metastasis. 3. Incidental findings, including: Coronary artery atherosclerosis. Aortic Atherosclerosis (ICD10-I70.0). Possible constipation.   11/18/2022 Cancer Staging   Staging form: Prostate, AJCC 8th Edition - Clinical stage from 11/18/2022: Stage IVB (cTX, cM1, PSA: 44, Grade Group: 4) - Signed by [name], MD on 11/18/2022 Stage prefix: Initial diagnosis Prostate specific antigen (PSA) range: 20 or greater Gleason score: 8 Histologic grading system: 5 grade system   11/30/2022 - 12/02/2022 Chemotherapy   Patient is on Treatment Plan : PROSTATE Docetaxel (76) q21d      Patient reports left hip pain radiating down to left lower extremity. His primary care provider prescribed oxycodone/acetaminophen 5/325 mg every 6 hours as needed.  He has not tried due to concern of side effects. Family history positive for brother with prostate cancer.  Today he was accompanied by 2 of his sons. Patient is active for his age.  He goes to gym 1-2 times per week.   INTERVAL HISTORY Nicolas Gonzales is a 72 y.o. male who has above history reviewed by me today presents for follow up visit for prostate cancer management.  Appetite is good. He resumed Xtandi 120mg  daily 2 weeks ago, start to experience diarrhea, multiple loose BM. He takes imodium with no relief.  Lower extremity swelling, improved with lasix, he ran out of lasix and swelling is worse. He has noticed rash on bilateral thigh, just above knees. Denies any new medication today.  [place] elstrography today showed findings suggesting portal hypertension.   MEDICAL HISTORY:  Past Medical History:  Diagnosis Date   BPH (benign prostatic hyperplasia)    Diabetes  mellitus without complication (HCC)    Elevated PSA    Hypertension     SURGICAL HISTORY: Past Surgical History:  Procedure Laterality Date   NO PAST SURGERIES     PROSTATE BIOPSY N/A 10/11/2022   Procedure: PROSTATE BIOPSY;  Surgeon: Riki Altes, MD;  Location: [facility] ORS;  Service: Urology;  Laterality: N/A;   TRANSRECTAL ULTRASOUND N/A 10/11/2022   Procedure: TRANSRECTAL ULTRASOUND;  Surgeon: Riki Altes, MD;  Location: [facility] ORS;  Service: Urology;  Laterality: N/A;    SOCIAL HISTORY: Social History   Socioeconomic History   Marital status: Married    Spouse name: Not on file   Number of children: Not on file   Years of education: Not on file   Highest education level: Not on file  Occupational History   Not on file  Tobacco Use   Smoking status: Never   Smokeless tobacco: Never  Vaping Use   Vaping status: Not on file  Substance and Sexual Activity   Alcohol use: Never   Drug  use: Never   Sexual activity: Not on file  Other Topics Concern   Not on file  Social History Narrative   Not on file   Social Determinants of Health   Financial Resource Strain: Not on file  Food Insecurity: No Food Insecurity (11/18/2022)   Hunger Vital Sign    Worried About Running Out of Food in the Last Year: Never true    Ran Out of Food in the Last Year: Never true  Transportation Needs: No Transportation Needs (11/18/2022)   PRAPARE - Administrator, Civil Service (Medical): No    Lack of Transportation (Non-Medical): No  Physical Activity: Not on file  Stress: Not on file  Social Connections: Not on file  Intimate Partner Violence: Not At Risk (11/18/2022)   Humiliation, Afraid, Rape, and Kick questionnaire    Fear of Current or Ex-Partner: No    Emotionally Abused: No    Physically Abused: No    Sexually Abused: No    FAMILY HISTORY: Family History  Problem Relation Age of Onset   Breast cancer Mother    Alzheimer's disease Father    Prostate cancer  Brother     ALLERGIES:  has No Known Allergies.  MEDICATIONS:  Current Outpatient Medications  Medication Sig Dispense Refill   Cholecalciferol (VITAMIN D-1000 MAX ST) 25 MCG (1000 UT) tablet Take 1,000 Units by mouth daily.     CINNAMON PO Take 1 tablet by mouth daily at 6 (six) AM.     COD LIVER OIL PO Take 1 tablet by mouth daily at 6 (six) AM.     cyanocobalamin (VITAMIN B12) 500 MCG tablet Take 1 tablet by mouth daily.     diphenoxylate-atropine (LOMOTIL) 2.5-0.025 MG tablet Take 1 tablet by mouth 4 (four) times daily as needed for diarrhea or loose stools. 60 tablet 0   FIBER PO Take 1 tablet by mouth daily at 6 (six) AM.     furosemide (LASIX) 20 MG tablet Take 1 tablet (20 mg total) by mouth daily as needed. 30 tablet 1   Multiple Vitamins-Minerals (ZINC PO) Take 1 tablet by mouth daily at 6 (six) AM.     tamsulosin (FLOMAX) 0.4 MG CAPS capsule Take 1 capsule (0.4 mg total) by mouth daily. 30 capsule 1   traMADol (ULTRAM) 50 MG tablet Take 50 mg by mouth every 6 (six) hours as needed.     metFORMIN (GLUCOPHAGE) 500 MG tablet Take 500 mg by mouth every morning. (Patient not taking: Reported on 01/18/2023)     No current facility-administered medications for this visit.    Review of Systems  Constitutional:  Positive for fatigue. Negative for appetite change, chills, fever and unexpected weight change.  HENT:   Negative for hearing loss and voice change.   Eyes:  Negative for eye problems and icterus.  Respiratory:  Negative for chest tightness, cough and shortness of breath.   Cardiovascular:  Positive for leg swelling. Negative for chest pain.  Gastrointestinal:  Positive for diarrhea. Negative for abdominal distention and abdominal pain.  Endocrine: Negative for hot flashes.  Genitourinary:  Negative for difficulty urinating, dysuria and frequency.   Musculoskeletal:  Negative for arthralgias.       Left hip pain  Skin:  Positive for rash. Negative for itching.   Neurological:  Negative for light-headedness and numbness.  Hematological:  Negative for adenopathy. Does not bruise/bleed easily.  Psychiatric/Behavioral:  Negative for confusion.      PHYSICAL EXAMINATION: ECOG PERFORMANCE STATUS: 1 -  Symptomatic but completely ambulatory  Vitals:   03/09/23 1004  BP: 128/81  Pulse: 74  Resp: 18  Temp: (!) 96 F (35.6 C)  SpO2: 100%   Filed Weights   03/09/23 1004  Weight: 176 lb 12.8 oz (80.2 kg)    Physical Exam Constitutional:      Appearance: He is not diaphoretic.  HENT:     Head: Normocephalic and atraumatic.  Eyes:     General: No scleral icterus. Cardiovascular:     Rate and Rhythm: Normal rate.  Pulmonary:     Effort: Pulmonary effort is normal. No respiratory distress.  Abdominal:     General: There is no distension.     Palpations: Abdomen is soft.  Musculoskeletal:        General: Normal range of motion.     Cervical back: Normal range of motion and neck supple.     Right lower leg: Edema present.     Left lower leg: Edema present.  Skin:    General: Skin is warm and dry.     Findings: No erythema.  Neurological:     Mental Status: He is alert and oriented to person, place, and time. Mental status is at baseline.     Cranial Nerves: No cranial nerve deficit.     Motor: No abnormal muscle tone.  Psychiatric:        Mood and Affect: Affect normal.      LABORATORY DATA:  I have reviewed the data as listed    Latest Ref Rng & Units 03/09/2023    9:53 AM 02/16/2023    2:30 PM 02/08/2023    9:20 AM  CBC  WBC 4.0 - 10.5 K/uL 6.1  6.3  5.6   Hemoglobin 13.0 - 17.0 g/dL 47.8  29.5  62.1   Hematocrit 39.0 - 52.0 % 37.1  37.1  36.4   Platelets 150 - 400 K/uL 301  278  199       Latest Ref Rng & Units 03/09/2023    9:53 AM 02/16/2023    2:30 PM 02/15/2023    8:55 AM  CMP  Glucose 70 - 99 mg/dL 308  657  846   BUN 8 - 23 mg/dL 11  15  17    Creatinine 0.61 - 1.24 mg/dL 9.62  9.52  8.41   Sodium 135 - 145 mmol/L  135  139  137   Potassium 3.5 - 5.1 mmol/L 4.4  3.5  3.5   Chloride 98 - 111 mmol/L 99  104  104   CO2 22 - 32 mmol/L 31  26  26    Calcium 8.9 - 10.3 mg/dL 8.4  8.3  7.9   Total Protein 6.5 - 8.1 g/dL 6.1  5.7  5.7   Total Bilirubin 0.3 - 1.2 mg/dL 1.6  1.3  1.1   Alkaline Phos 38 - 126 U/L 182  79  81   AST 15 - 41 U/L 108  111  70   ALT 0 - 44 U/L 145  130  88      RADIOGRAPHIC STUDIES: I have personally reviewed the radiological images as listed and agreed with the findings in the report. US Venous Img Lower Bilateral (DVT)  Result Date: 03/08/2023 CLINICAL DATA:  bilateral leg edema EXAM: BILATERAL LOWER EXTREMITY VENOUS DOPPLER ULTRASOUND TECHNIQUE: Gray-scale sonography with graded compression, as well as color Doppler and duplex ultrasound were performed to evaluate the lower extremity deep venous systems from the level of the common  femoral vein and including the common femoral, femoral, profunda femoral, popliteal and calf veins including the posterior tibial, peroneal and gastrocnemius veins when visible. The superficial great saphenous vein was also interrogated. Spectral Doppler was utilized to evaluate flow at rest and with distal augmentation maneuvers in the common femoral, femoral and popliteal veins. COMPARISON:  Lower extremity venous ultrasound, 02/17/2023. PET-CT, 11/07/2022. FINDINGS: RIGHT LOWER EXTREMITY VENOUS Normal compressibility of the RIGHT common femoral, superficial femoral, and popliteal veins, as well as the visualized calf veins. Visualized portions of profunda femoral vein and great saphenous vein unremarkable. No filling defects to suggest DVT on grayscale or color Doppler imaging. Doppler waveforms show normal direction of venous flow, normal respiratory plasticity and response to augmentation. OTHER No evidence of superficial thrombophlebitis or abnormal fluid collection. Subcutaneous edema of the imaged distal RIGHT lower extremity Limitations: none LEFT LOWER  EXTREMITY VENOUS Normal compressibility of the LEFT common femoral, superficial femoral, and popliteal veins, as well as the visualized calf veins. Visualized portions of profunda femoral vein and great saphenous vein unremarkable. No filling defects to suggest DVT on grayscale or color Doppler imaging. Doppler waveforms show normal direction of venous flow, normal respiratory plasticity and response to augmentation. OTHER No evidence of superficial thrombophlebitis. At the posterior LEFT knee within the fossa is a well-circumscribed anechoic and avascular fluid collection measuring approximately 2.6 x 1.8 x 2.4 cm, consistent with a popliteal fossa/Baker cyst. Subcutaneous edema of the imaged distal LEFT lower extremity Limitations: none IMPRESSION: 1. No evidence of femoropopliteal DVT or superficial thrombophlebitis within either lower extremity. 2. Incidental 3 cm LEFT popliteal fossa/Baker cyst. Roanna Banning, MD [facility] Electronically Signed   By: Roanna Banning M.D.   On: 03/08/2023 13:09   ECHOCARDIOGRAM COMPLETE  Result Date: 03/03/2023    ECHOCARDIOGRAM REPORT   Patient Name:   LEMUEL KREINER Date of Exam: 03/03/2023 Medical Rec #:  [id number]       Height:       68.0 in Accession #:    [id number]      Weight:       150.8 lb Date of Birth:  28-Sep-1950      BSA:          1.813 m Patient Age:    71 years        BP:           116/84 mmHg Patient Gender: M               HR:           101 bpm. Exam Location:  [facility] Procedure: 2D Echo, Cardiac Doppler and Color Doppler Indications:     Leg swelling  History:         Patient has no prior history of Echocardiogram examinations.  Sonographer:     Cristela Blue Referring Phys:  [id number]   Diagnosing Phys: Debbe Odea MD IMPRESSIONS  1. Left ventricular ejection fraction, by estimation, is 60 to 65%. The left ventricle has normal function. The left ventricle has no regional wall motion abnormalities.  There is mild left ventricular hypertrophy. Left ventricular diastolic parameters were normal.  2. Right ventricular systolic function is normal. The right ventricular size is normal.  3. The mitral valve is normal in structure. Mild mitral valve regurgitation.  4. The aortic valve is tricuspid. Aortic valve regurgitation is not visualized. Aortic valve sclerosis/calcification is present, without any evidence of aortic stenosis.  5. The inferior vena cava  is normal in size with greater than 50% respiratory variability, suggesting right atrial pressure of 3 mmHg. FINDINGS  Left Ventricle: Left ventricular ejection fraction, by estimation, is 60 to 65%. The left ventricle has normal function. The left ventricle has no regional wall motion abnormalities. The left ventricular internal cavity size was normal in size. There is  mild left ventricular hypertrophy. Left ventricular diastolic parameters were normal. Right Ventricle: The right ventricular size is normal. No increase in right ventricular wall thickness. Right ventricular systolic function is normal. Left Atrium: Left atrial size was normal in size. Right Atrium: Right atrial size was normal in size. Pericardium: There is no evidence of pericardial effusion. Mitral Valve: The mitral valve is normal in structure. Mild mitral valve regurgitation. MV peak gradient, 4.4 mmHg. The mean mitral valve gradient is 2.0 mmHg. Tricuspid Valve: The tricuspid valve is normal in structure. Tricuspid valve regurgitation is mild. Aortic Valve: The aortic valve is tricuspid. Aortic valve regurgitation is not visualized. Aortic valve sclerosis/calcification is present, without any evidence of aortic stenosis. Aortic valve mean gradient measures 3.0 mmHg. Aortic valve peak gradient measures 4.8 mmHg. Aortic valve area, by VTI measures 2.84 cm. Pulmonic Valve: The pulmonic valve was not well visualized. Pulmonic valve regurgitation is not visualized. Aorta: The aortic root is  normal in size and structure. Venous: The inferior vena cava is normal in size with greater than 50% respiratory variability, suggesting right atrial pressure of 3 mmHg. IAS/Shunts: No atrial level shunt detected by color flow Doppler.  LEFT VENTRICLE PLAX 2D LVIDd:         4.20 cm   Diastology LVIDs:         2.50 cm   LV e' medial:    11.20 cm/s LV PW:         1.30 cm   LV E/e' medial:  8.0 LV IVS:        1.50 cm   LV e' lateral:   12.70 cm/s LVOT diam:     2.00 cm   LV E/e' lateral: 7.0 LV SV:         56 LV SV Index:   31 LVOT Area:     3.14 cm  RIGHT VENTRICLE RV Basal diam:  3.70 cm RV Mid diam:    3.40 cm RV S prime:     16.60 cm/s TAPSE (M-mode): 2.7 cm LEFT ATRIUM             Index        RIGHT ATRIUM           Index LA diam:        3.50 cm 1.93 cm/m   RA Area:     17.50 cm LA Vol (A2C):   37.2 ml 20.52 ml/m  RA Volume:   40.40 ml  22.29 ml/m LA Vol (A4C):   44.4 ml 24.49 ml/m LA Biplane Vol: 40.9 ml 22.56 ml/m  AORTIC VALVE AV Area (Vmax):    2.51 cm AV Area (Vmean):   2.47 cm AV Area (VTI):     2.84 cm AV Vmax:           109.00 cm/s AV Vmean:          73.000 cm/s AV VTI:            0.198 m AV Peak Grad:      4.8 mmHg AV Mean Grad:      3.0 mmHg LVOT Vmax:  87.00 cm/s LVOT Vmean:        57.300 cm/s LVOT VTI:          0.179 m LVOT/AV VTI ratio: 0.90  AORTA Ao Root diam: 3.00 cm MITRAL VALVE               TRICUSPID VALVE MV Area (PHT): 4.19 cm    TR Peak grad:   21.7 mmHg MV Area VTI:   1.79 cm    TR Vmax:        233.00 cm/s MV Peak grad:  4.4 mmHg MV Mean grad:  2.0 mmHg    SHUNTS MV Vmax:       1.05 m/s    Systemic VTI:  0.18 m MV Vmean:      65.5 cm/s   Systemic Diam: 2.00 cm MV Decel Time: 181 msec MV E velocity: 89.10 cm/s MV A velocity: 97.50 cm/s MV E/A ratio:  0.91 Debbe Odea MD Electronically signed by Debbe Odea MD Signature Date/Time: 03/03/2023/1:04:29 PM    Final    [place] ELASTOGRAPHY LIVER  Result Date: 02/16/2023 CLINICAL DATA:  Hepatitis-C EXAM: [place] ELASTOGRAPHY  HEPATIC TECHNIQUE: Sonography of the liver was performed. In addition, ultrasound elastography evaluation of the liver was performed. A region of interest was placed within the right lobe of the liver. Following application of a compressive sonographic pulse, tissue compressibility was assessed. Multiple assessments were performed at the selected site. Median tissue compressibility was determined. Previously, hepatic stiffness was assessed by shear wave velocity. Based on recently published Society of Radiologists in Ultrasound consensus article, reporting is now recommended to be performed in the SI units of pressure (kiloPascals) representing hepatic stiffness/elasticity. The obtained result is compared to the published reference standards. (cACLD = compensated Advanced Chronic Liver Disease) COMPARISON:  Ultrasound abdomen dated 12/22/2022 FINDINGS: Liver: No focal lesion identified. Within normal limits in parenchymal echogenicity. Portal vein is patent on color Doppler imaging with normal direction of blood flow towards the liver. Trace perihepatic free fluid. Incidentally noted echogenicities within the gallbladder, likely sludge. ULTRASOUND HEPATIC ELASTOGRAPHY Device: Siemens Helix VTQ Patient position: Oblique Transducer: [id number] Number of measurements: 10 Hepatic segment:  8 Median kPa: 24.9 IQR: 3.2 IQR/Median kPa ratio: 0.1 Data quality:  Good Diagnostic category: > or =17 kPa: highly suggestive of cACLD with an increased probability of clinically significant portal hypertension The use of hepatic elastography is applicable to patients with viral hepatitis and non-alcoholic fatty liver disease. At this time, there is insufficient data for the referenced cut-off values and use in other causes of liver disease, including alcoholic liver disease. Patients, however, may be assessed by elastography and serve as their own reference standard/baseline. In patients with non-alcoholic liver disease, the values  suggesting compensated advanced chronic liver disease (cACLD) may be lower, and patients may need additional testing with elasticity results of 7-9 kPa. Please note that abnormal hepatic elasticity and shear wave velocities may also be identified in clinical settings other than with hepatic fibrosis, such as: acute hepatitis, elevated right heart and central venous pressures including use of beta blockers, veno-occlusive disease (Budd-Chiari), infiltrative processes such as mastocytosis/amyloidosis/infiltrative tumor/lymphoma, extrahepatic cholestasis, with hyperemia in the post-prandial state, and with liver transplantation. Correlation with patient history, laboratory data, and clinical condition recommended. Diagnostic Categories: < or =5 kPa: high probability of being normal < or =9 kPa: in the absence of other known clinical signs, rules out cACLD >9 kPa and ?13 kPa: suggestive of cACLD, but needs further testing >13 kPa: highly suggestive  of cACLD > or =17 kPa: highly suggestive of cACLD with an increased probability of clinically significant portal hypertension IMPRESSION: ULTRASOUND LIVER: 1. No focal hepatic lesions. 2. Trace perihepatic ascites. 3. Incidentally noted gallbladder sludge. ULTRASOUND HEPATIC ELASTOGRAPHY: Median kPa:  24.9 Diagnostic category: > or =17 kPa: highly suggestive of cACLD with an increased probability of clinically significant portal hypertension Electronically Signed   By: Agustin Cree M.D.   On: 02/16/2023 09:01

## 2023-03-09 NOTE — Assessment & Plan Note (Signed)
Follow-up with gastroenterology for hepatitis C treatments.

## 2023-03-09 NOTE — Assessment & Plan Note (Signed)
Check ultrasound lower extremity to rule out DVT.-Studies negative. Echo showed normal LVEF Possibly due to portal hypertension.  Follow up with PCP and GI for further evaluation.  Continue diuretics.

## 2023-03-15 ENCOUNTER — Other Ambulatory Visit: Payer: Self-pay

## 2023-03-15 ENCOUNTER — Telehealth: Payer: Self-pay

## 2023-03-15 DIAGNOSIS — C61 Malignant neoplasm of prostate: Secondary | ICD-10-CM

## 2023-03-15 NOTE — Telephone Encounter (Signed)
-----   Message from [name] sent at 03/14/2023 10:07 PM EDT ----- Please advise patient to repeat PSA in 1 month.

## 2023-03-15 NOTE — Telephone Encounter (Signed)
Called and left message with patient's son informing him that Dr. Cathie Hoops would lie to repeat PSA in 1 month. I will have Morrie Sheldon and notify patient/son of appointment date and time.

## 2023-04-12 ENCOUNTER — Inpatient Hospital Stay: Payer: PPO | Attending: Oncology

## 2023-05-30 ENCOUNTER — Other Ambulatory Visit: Payer: Self-pay

## 2023-05-30 ENCOUNTER — Ambulatory Visit: Payer: PPO | Attending: Physical Medicine and Rehabilitation

## 2023-05-30 DIAGNOSIS — M5459 Other low back pain: Secondary | ICD-10-CM | POA: Diagnosis present

## 2023-05-30 DIAGNOSIS — R2681 Unsteadiness on feet: Secondary | ICD-10-CM | POA: Diagnosis present

## 2023-05-30 DIAGNOSIS — M6281 Muscle weakness (generalized): Secondary | ICD-10-CM | POA: Diagnosis present

## 2023-05-30 NOTE — Therapy (Addendum)
OUTPATIENT PHYSICAL THERAPY THORACOLUMBAR EVALUATION   Patient Name: Nicolas Gonzales MRN: [id number] DOB:07-08-1951, 72 y.o., male Today's Date: 05/30/2023  END OF SESSION:  PT End of Session - 05/30/23 1316     Visit Number 1    Number of Visits 17    Date for PT Re-Evaluation 07/25/23    PT Start Time 0900    PT Stop Time 0944    PT Time Calculation (min) 44 min    Equipment Utilized During Treatment Gait belt    Activity Tolerance Patient tolerated treatment well    Behavior During Therapy WFL for tasks assessed/performed             Past Medical History:  Diagnosis Date   BPH (benign prostatic hyperplasia)    Diabetes mellitus without complication (HCC)    Elevated PSA    Hypertension    Past Surgical History:  Procedure Laterality Date   NO PAST SURGERIES     PROSTATE BIOPSY N/A 10/11/2022   Procedure: PROSTATE BIOPSY;  Surgeon: Riki Altes, MD;  Location: [facility];  Service: Urology;  Laterality: N/A;   TRANSRECTAL ULTRASOUND N/A 10/11/2022   Procedure: TRANSRECTAL ULTRASOUND;  Surgeon: Riki Altes, MD;  Location: [facility];  Service: Urology;  Laterality: N/A;   Patient Active Problem List   Diagnosis Date Noted   Skin rash 02/16/2023   Leg edema 01/18/2023   HCV (hepatitis C virus) 12/21/2022   Androgen deprivation therapy 12/21/2022   Cellulitis, umbilical 12/09/2022   Weight loss 12/09/2022   Uncontrolled diabetes mellitus with hyperglycemia (HCC) 12/09/2022   Prostate cancer (HCC) 11/18/2022   Goals of care, counseling/discussion 11/18/2022   Hyponatremia 11/18/2022   Transaminitis 11/18/2022   Neoplasm related pain 11/18/2022   Benign prostatic hyperplasia without lower urinary tract symptoms 07/26/2022   Controlled type 2 diabetes mellitus without complication, without long-term current use of insulin (HCC) 07/26/2022   Hypertension, essential 07/26/2022   Upper GI bleed 01/04/2017    PCP: Gracelyn Nurse, MD  REFERRING PROVIDER:  Caro Hight, MD  REFERRING DIAG: M54.50 (ICD-10-CM) - Low back pain, unspecified G95.20 (ICD-10-CM) - Unspecified cord compression  Rationale for Evaluation and Treatment: Rehabilitation  THERAPY DIAG:  Muscle weakness (generalized)  Other low back pain  Unsteadiness on feet  ONSET DATE: Following 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 Pt presents with 5/10 NPS LBP at rest and with activity. Pt describes his pain as dull and sometimes this pain radiates from the low back to the lateral aspect of the L thigh. Pt's LBP is aggravated with prolonged walking and standing activities along with climbing stairs. His pain is managed with OTC Tylenol. Pt currently does not use any AD for ambulation, and has reported no falls within the last year however, has frequent instances of near falls. Pt denies N/T in bilat LE's.   PAIN:  Are you having pain? Yes: NPRS scale: 5/10 Pain location: Low back pain  Pain description: dull pain, some radiating pain in the L thigh Aggravating factors: prolonged walking and standing, climbing up stairs  Relieving factors: Tylenol   PRECAUTIONS: Fall  RED FLAGS: None   WEIGHT BEARING RESTRICTIONS: No  FALLS:  Has patient fallen in last 6 months? No Near falls.   LIVING ENVIRONMENT: Pt has 3 steps to enter his home w/ rails. (Will specify railing location at next visit)   OCCUPATION: Retired   PLOF: Independent  PATIENT GOALS: Relieve the pain and get stronger.   NEXT MD VISIT:  December, 2024  OBJECTIVE:  Note: Objective measures were completed at Evaluation unless otherwise noted.  DIAGNOSTIC FINDINGS:  N/A  PATIENT SURVEYS:  FOTO Deferred to next session  SCREENING FOR RED FLAGS: Bowel or bladder incontinence: No Spinal tumors: No  COGNITION: Overall cognitive status: Within functional limits for tasks assessed     SENSATION: WFL  MUSCLE LENGTH: Hamstrings: ~90 degrees bilat  POSTURE:  Forward trunk lean with ambulation  PALPATION: TTP at L lumbar sided paraspinals and into L buttock region.   LUMBAR ROM: Deferred to next session.  AROM eval  Flexion   Extension   Right lateral flexion   Left lateral flexion   Right rotation   Left rotation    (Blank rows = not tested)  LOWER EXTREMITY ROM:     Active  Right AROM eval Right PROM eval Left AROM eval Left PROM eval  Hip flexion      Hip extension      Hip abduction      Hip adduction      Hip internal rotation      Hip external rotation      Knee flexion      Knee extension      Ankle dorsiflexion 10 20 20 25   Ankle plantarflexion      Ankle inversion      Ankle eversion       (Blank rows = not tested)  LOWER EXTREMITY MMT:    MMT Right eval Left eval  Hip flexion 4- 4-  Hip extension 4- 4-  Hip abduction 4- 4-  Hip adduction    Hip internal rotation 4- 4-  Hip external rotation 4- 4-  Knee flexion 4 4  Knee extension 4 4  Ankle dorsiflexion 2+ 4  Ankle plantarflexion 4 4   (Blank rows = not tested)  LUMBAR SPECIAL TESTS:  FABER test: Negative and FADDIR: negative  FUNCTIONAL TESTS:  5 times sit to stand: 26.12 seconds Functional gait assessment: 16/30  JOINT MOBILITY:  Joint hypomobility noted w/ CPA's at L2- L5 vertebrae with concordant pain.   GAIT:  Pt ambulates with WBOS, decreased cadence, and forward trunk positioning.   TODAY'S TREATMENT:                                                                                                                               DATE: 05/30/23    Eval Only, reviewed HEP (reps/ sets/ freq)   PATIENT EDUCATION:  Education details: HEP Person educated: Patient Education method: Medical illustrator Education comprehension: verbalized understanding and returned demonstration  HOME EXERCISE PROGRAM: Access Code: [email or web address] URL: [email or web address] Date: 05/30/2023 Prepared by: Ronnie Derby  Exercises - Sit to Stand Without Arm Support  - 1 x daily - 4-5 x weekly - 3 sets - 6 reps - Seated Toe Raise  - 1 x daily - 7 x weekly - 3 sets - 8 reps  ASSESSMENT:  CLINICAL IMPRESSION: Patient is a 72 y.o. M who was seen today for physical therapy evaluation and treatment for chronic LBP. Pt presents with LBP following a spinal surgery to remove mets from his spinal cord. Pt presents with L2-L5 joint hypomobility noted w/ CPA's and concordant pain. Pt is classified at high risk for falls as evidenced by 5xSTS, and FGA score compared to age matched norms.(See above). As well as notes proximal hip weakness and R ankle DF weakness and decreased AROM leading to several near falls in the community. These impairments are limiting pt's functional mobility and ability to perform ADL's around the home. Pt will benefit from skilled PT services to improve bilat LE strength, reduce LBP, maximize return to independence and reduced falls risk.   OBJECTIVE IMPAIRMENTS: Abnormal gait, decreased activity tolerance, decreased balance, difficulty walking, decreased ROM, decreased strength, hypomobility, and pain.   ACTIVITY LIMITATIONS: carrying, lifting, bending, standing, squatting, stairs, and bed mobility  PARTICIPATION LIMITATIONS: community activity  PERSONAL FACTORS: Age, Past/current experiences, Time since onset of injury/illness/exacerbation, and 1 comorbidity: DM  are also affecting patient's functional outcome.   REHAB POTENTIAL: Good  CLINICAL DECISION MAKING:  Stable/uncomplicated  EVALUATION COMPLEXITY: Low   GOALS: Goals reviewed with patient? Yes  SHORT TERM GOALS: Target date: 06/27/23  Pt will be independent with HEP to improve bilat LE strength and decrease LBP with functional activities  Baseline: 05/30/23: HEP given to pt Goal status: INITIAL  LONG TERM GOALS: Target date: 07/25/23  Pt will improve FOTO to target score to demonstrate clinically significant improvement in functional mobility.  Baseline: 05/30/23: Defer to next session.  Goal status: INITIAL  2. Pt will improve 5xSTS to 12.6 seconds or less without UE support to demonstrate improved LE strength to match age based norms.  Baseline: 05/30/23: 26.12 seconds Goal status: INITIAL  3.  Pt will improve FGA by at least 6 points to demonstrate clinically significant improvement in balance with community activities.  Baseline: 05/30/23: 16/30 Goal status: INITIAL  4.  Pt will increase 6 MWT by > 165' to display improvements in functional endurance with community ambulation.  Baseline:  05/30/23: Deferred to next session. Goal status: INITIAL  5.  Pt will improve R ankle DF AROM by 7 degrees to note clinically significant improvements in AROM and improved safety with ambulation.  Baseline: 05/30/23: 10 degrees  Goal status: INITIAL   PLAN:  PT FREQUENCY: 2x/week  PT DURATION: 8 weeks  PLANNED INTERVENTIONS: 97164- PT Re-evaluation, 97110-Therapeutic exercises, 97530- Therapeutic activity, 97112- Neuromuscular re-education, 97535- Self Care, 56433- Manual therapy, [phone]- Gait training, Patient/Family education, Balance training, Stair training, Joint mobilization, Joint manipulation, Spinal manipulation, Spinal mobilization, Cryotherapy, and Moist heat.  PLAN FOR NEXT SESSION: Assess lumbar AROM, , add/ reassess HEP  Lovie Macadamia, SPT  Delphia Grates. Fairly IV, PT, DPT Physical Therapist- [place]  [facility]  05/30/2023, 3:18 PM

## 2023-06-02 ENCOUNTER — Ambulatory Visit: Payer: PPO | Attending: Physical Medicine and Rehabilitation

## 2023-06-02 DIAGNOSIS — R2681 Unsteadiness on feet: Secondary | ICD-10-CM

## 2023-06-02 DIAGNOSIS — M6281 Muscle weakness (generalized): Secondary | ICD-10-CM

## 2023-06-02 DIAGNOSIS — M5459 Other low back pain: Secondary | ICD-10-CM

## 2023-06-02 NOTE — Therapy (Addendum)
OUTPATIENT PHYSICAL THERAPY THORACOLUMBAR TREATMENT   Patient Name: Nicolas Gonzales MRN: [id number] DOB:[birth year], 72 y.o., male Today's Date: 06/02/2023  END OF SESSION:  PT End of Session - 06/02/23 0847     Visit Number 2    Number of Visits 17    Date for PT Re-Evaluation 07/25/23    PT Start Time 0855    PT Stop Time 0942    PT Time Calculation (min) 47 min    Equipment Utilized During Treatment Gait belt    Activity Tolerance Patient tolerated treatment well    Behavior During Therapy WFL for tasks assessed/performed             Past Medical History:  Diagnosis Date   BPH (benign prostatic hyperplasia)    Diabetes mellitus without complication (HCC)    Elevated PSA    Hypertension    Past Surgical History:  Procedure Laterality Date   NO PAST SURGERIES     PROSTATE BIOPSY N/A 10/11/2022   Procedure: PROSTATE BIOPSY;  Surgeon: Riki Altes, MD;  Location: [facility];  Service: Urology;  Laterality: N/A;   TRANSRECTAL ULTRASOUND N/A 10/11/2022   Procedure: TRANSRECTAL ULTRASOUND;  Surgeon: Riki Altes, MD;  Location: [facility];  Service: Urology;  Laterality: N/A;   Patient Active Problem List   Diagnosis Date Noted   Skin rash 02/16/2023   Leg edema 01/18/2023   HCV (hepatitis C virus) 12/21/2022   Androgen deprivation therapy 12/21/2022   Cellulitis, umbilical 12/09/2022   Weight loss 12/09/2022   Uncontrolled diabetes mellitus with hyperglycemia (HCC) 12/09/2022   Prostate cancer (HCC) 11/18/2022   Goals of care, counseling/discussion 11/18/2022   Hyponatremia 11/18/2022   Transaminitis 11/18/2022   Neoplasm related pain 11/18/2022   Benign prostatic hyperplasia without lower urinary tract symptoms 07/26/2022   Controlled type 2 diabetes mellitus without complication, without long-term current use of insulin (HCC) 07/26/2022   Hypertension, essential 07/26/2022   Upper GI bleed 01/04/2017    PCP: Gracelyn Nurse, MD  REFERRING PROVIDER:  Caro Hight, MD  REFERRING DIAG: M54.50 (ICD-10-CM) - Low back pain, unspecified G95.20 (ICD-10-CM) - Unspecified cord compression  Rationale for Evaluation and Treatment: Rehabilitation  THERAPY DIAG:  Other low back pain  Muscle weakness (generalized)  Unsteadiness on feet  ONSET DATE: Following 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 Reports difficulty completing STS exercise in his HEP. No notable changes since last session.    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 Pt presents with 5/10 NPS LBP at rest and with activity. Pt describes his pain as dull and sometimes this pain radiates from the low back to the lateral aspect of the L thigh. Pt's LBP is aggravated with prolonged walking and standing activities along with climbing stairs. His pain is managed with OTC Tylenol. Pt currently does not use any AD for ambulation, and has reported no falls within the last year however, has frequent instances of near falls. Pt denies N/T in bilat LE's.   PAIN:  Are you having pain? Yes: NPRS scale: 5/10 Pain location: Low back pain  Pain description: dull pain, some radiating pain in the L thigh Aggravating factors: prolonged walking and standing, climbing up stairs  Relieving factors: Tylenol   PRECAUTIONS: Fall  RED FLAGS: None   WEIGHT BEARING RESTRICTIONS: No  FALLS:  Has patient fallen in last 6 months? No Near falls.   LIVING ENVIRONMENT: Pt has 3 steps to enter his home w/ rails. (Will specify railing location at next visit)   OCCUPATION: Retired   PLOF:  Independent  PATIENT GOALS: Relieve the pain and get stronger.   NEXT MD VISIT: December, 2024  OBJECTIVE:  Note: Objective measures were completed at Evaluation unless otherwise noted.  DIAGNOSTIC FINDINGS:  N/A  PATIENT SURVEYS:  FOTO 53/63  SCREENING FOR RED FLAGS: Bowel or bladder incontinence: No Spinal tumors: No  COGNITION: Overall cognitive status: Within functional limits for tasks assessed     SENSATION: WFL  MUSCLE LENGTH: Hamstrings: ~90 degrees bilat  POSTURE:  Forward trunk lean with ambulation  PALPATION: TTP at L lumbar sided paraspinals and into L buttock region.   LUMBAR ROM: Deferred to next session.  AROM eval  Flexion   Extension   Right lateral flexion   Left lateral flexion   Right rotation   Left rotation    (Blank rows = not tested)  LOWER EXTREMITY ROM:     Active  Right AROM eval Right PROM eval Left AROM eval Left PROM eval  Hip flexion      Hip extension      Hip abduction      Hip adduction      Hip internal rotation      Hip external rotation      Knee flexion      Knee extension      Ankle dorsiflexion 10 20 20 25   Ankle plantarflexion      Ankle inversion      Ankle eversion       (Blank rows = not tested)  LOWER EXTREMITY MMT:    MMT Right eval Left eval  Hip flexion 4- 4-  Hip extension 4- 4-  Hip abduction 4- 4-  Hip adduction    Hip internal rotation 4- 4-  Hip external rotation 4- 4-  Knee flexion 4 4  Knee extension 4 4  Ankle dorsiflexion 2+ 4  Ankle plantarflexion 4 4   (Blank rows = not tested)  LUMBAR SPECIAL TESTS:  FABER test: Negative and FADDIR: negative  FUNCTIONAL TESTS:  5 times sit to stand: 26.12 seconds Functional gait assessment: 16/30 : 737ft   JOINT MOBILITY:  Joint hypomobility noted w/  CPA's at L2- L5 vertebrae with concordant pain.   GAIT:  Pt ambulates with WBOS, decreased cadence, and forward trunk positioning.   TODAY'S TREATMENT:                                                                                                                               DATE: 06/02/23    administered: 775ft no increase in LBP, no rest breaks  STS from plinth table 2x6 no UE support Supine RLE/LLE PF/DF 2 x10  Sidelying RLE/LLE Clamshells 2 x10/ each side Sidelying RLE/LLE Reverse clamshells 2 x10/ each side Tandem standing RLE/LLE w/ no UE support 2 x30sec/ each side   PATIENT EDUCATION:  Education details: HEP Person educated: Patient Education method: Medical illustrator Education comprehension: verbalized understanding and returned demonstration  HOME EXERCISE PROGRAM: Access Code: [email or web address] URL: [email or web address] Date: 05/30/2023 Prepared by: Ronnie Derby  Exercises - Sit to Stand Without Arm Support  - 1 x daily - 4-5 x weekly - 3 sets - 6 reps - Seated Toe Raise  - 1 x daily - 7 x weekly - 3 sets - 8 reps  ASSESSMENT:  CLINICAL IMPRESSION:  Session focused on administering the and reassessing pt's HEP. Pt able to ambulate 723ft w/ 5-6/10 NPS prior to beginning activity and no increase in LBP following activity. Pt did not require any rest breaks throughout assessment noting adequate endurance to complete activity, however pt's ambulation distance is significantly less than his age-matched norm of 171ft signifying necessity of bilat LE strengthening. Pt's HEP updated today, and reviewed improved technique to complete STS and will reassess at next visit. Pt tolerated HEP updates well emphasizing proximal hip strengthening and balance activities. Pt will benefit from skilled PT services to improve bilat LE strength, reduce LBP, maximize return to independence and reduced falls risk.   OBJECTIVE IMPAIRMENTS: Abnormal gait, decreased activity tolerance, decreased balance, difficulty walking, decreased ROM, decreased strength, hypomobility, and pain.   ACTIVITY LIMITATIONS: carrying, lifting, bending, standing,  squatting, stairs, and bed mobility  PARTICIPATION LIMITATIONS: community activity  PERSONAL FACTORS: Age, Past/current experiences, Time since onset of injury/illness/exacerbation, and 1 comorbidity: DM  are also affecting patient's functional outcome.   REHAB POTENTIAL: Good  CLINICAL DECISION MAKING: Stable/uncomplicated  EVALUATION COMPLEXITY: Low   GOALS: Goals reviewed with patient? Yes  SHORT TERM GOALS: Target date: 06/27/23  Pt will be independent with HEP to improve bilat LE strength and decrease LBP with functional activities  Baseline: 05/30/23: HEP given to pt Goal status: INITIAL  LONG TERM GOALS: Target date: 07/25/23  Pt will improve FOTO to target score to demonstrate clinically significant improvement in functional mobility.  Baseline: 05/30/23: Defer to next session; 06/02/23: 53/63 Goal status: INITIAL  2. Pt will improve 5xSTS to 12.6 seconds or less without UE support to demonstrate improved LE strength to match age based norms.  Baseline: 05/30/23: 26.12 seconds Goal status: INITIAL  3.  Pt will improve  FGA by at least 6 points to demonstrate clinically significant improvement in balance with community activities.  Baseline: 05/30/23: 16/30 Goal status: INITIAL  4.  Pt will increase 6 MWT by > 165' to display improvements in functional endurance with community ambulation.  Baseline: 05/30/23: Deferred to next session. 06/02/23: 735ft w/ 5-6/10 NPS  Goal status: INITIAL  5.  Pt will improve R ankle DF AROM by 7 degrees to note clinically significant improvements in AROM and improved safety with ambulation.  Baseline: 05/30/23: 10 degrees  Goal status: INITIAL   PLAN:  PT FREQUENCY: 2x/week  PT DURATION: 8 weeks  PLANNED INTERVENTIONS: 97164- PT Re-evaluation, 97110-Therapeutic exercises, 97530- Therapeutic activity, 97112- Neuromuscular re-education, 97535- Self Care, 40347- Manual therapy, [phone]- Gait training, Patient/Family education, Balance  training, Stair training, Joint mobilization, Joint manipulation, Spinal manipulation, Spinal mobilization, Cryotherapy, and Moist heat.  PLAN FOR NEXT SESSION: Assess lumbar AROM,  Reassess HEP, continue to emphasize bilat LE strengthening and lumbar mobility exercises  Lovie Macadamia, SPT  Delphia Grates. Fairly IV, PT, DPT Physical Therapist- [place]  [facility]  06/02/2023, 12:26 PM

## 2023-06-06 ENCOUNTER — Ambulatory Visit: Payer: PPO

## 2023-06-06 DIAGNOSIS — R2681 Unsteadiness on feet: Secondary | ICD-10-CM

## 2023-06-06 DIAGNOSIS — M5459 Other low back pain: Secondary | ICD-10-CM | POA: Diagnosis not present

## 2023-06-06 DIAGNOSIS — M6281 Muscle weakness (generalized): Secondary | ICD-10-CM

## 2023-06-06 NOTE — Therapy (Cosign Needed)
OUTPATIENT PHYSICAL THERAPY THORACOLUMBAR TREATMENT   Patient Name: Nicolas Gonzales MRN: [id number] DOB:02-19-1951, 72 y.o., male Today's Date: 06/07/2023  END OF SESSION:  PT End of Session - 06/06/23 1503     Visit Number 3    Number of Visits 17    Date for PT Re-Evaluation 07/25/23    PT Start Time 1510    PT Stop Time 1554    PT Time Calculation (min) 44 min    Equipment Utilized During Treatment Gait belt    Activity Tolerance Patient tolerated treatment well    Behavior During Therapy WFL for tasks assessed/performed             Past Medical History:  Diagnosis Date   BPH (benign prostatic hyperplasia)    Diabetes mellitus without complication (HCC)    Elevated PSA    Hypertension    Past Surgical History:  Procedure Laterality Date   NO PAST SURGERIES     PROSTATE BIOPSY N/A 10/11/2022   Procedure: PROSTATE BIOPSY;  Surgeon: Riki Altes, MD;  Location: [facility] ORS;  Service: Urology;  Laterality: N/A;   TRANSRECTAL ULTRASOUND N/A 10/11/2022   Procedure: TRANSRECTAL ULTRASOUND;  Surgeon: Riki Altes, MD;  Location: [facility] OR[facility];  Service: Urology;  Laterality: N/A;   Patient Active Problem List   Diagnosis Date Noted   Skin rash 02/16/2023   Leg edema 01/18/2023   HCV (hepatitis C virus) 12/21/2022   Androgen deprivation therapy 12/21/2022   Cellulitis, umbilical 12/09/2022   Weight loss 12/09/2022   Uncontrolled diabetes mellitus with hyperglycemia (HCC) 12/09/2022   Prostate cancer (HCC) 11/18/2022   Goals of care, counseling/discussion 11/18/2022   Hyponatremia 11/18/2022   Transaminitis 11/18/2022   Neoplasm related pain 11/18/2022   Benign prostatic hyperplasia without lower urinary tract symptoms 07/26/2022   Controlled type 2 diabetes mellitus without complication, without long-term current use of insulin (HCC) 07/26/2022   Hypertension, essential 07/26/2022   Upper GI bleed 01/04/2017    PCP: Gracelyn Nurse, MD  REFERRING PROVIDER:  Caro Hight, MD  REFERRING DIAG: M54.50 (ICD-10-CM) - Low back pain, unspecified G95.20 (ICD-10-CM) - Unspecified cord compression  Rationale for Evaluation and Treatment: Rehabilitation  THERAPY DIAG:  Other low back pain  Muscle weakness (generalized)  Unsteadiness on feet  ONSET DATE: Following back surgery to remove mets on spinal cord on 03/22/23   SUBJECTIVE:                                                                                                                                                                                           SUBJECTIVE STATEMENT: Pt reports 3-4/10  NPS in the low back at today's session. Pt continues to report difficulty completing STS exercise in his HEP.   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 Pt presents with 5/10 NPS LBP at rest and with activity. Pt describes his pain as dull and sometimes this pain radiates from the low back to the lateral aspect of the L thigh. Pt's LBP is aggravated with prolonged walking and standing activities along with climbing stairs. His pain is managed with OTC Tylenol. Pt currently does not use any AD for ambulation, and has reported no falls within the last year however, has frequent instances of near falls. Pt denies N/T in bilat LE's.   PAIN:  Are you having pain? Yes: NPRS scale: 3-4/10 Pain location: Low back pain  Pain description: dull pain, some radiating pain in the L thigh Aggravating factors: prolonged walking and standing, climbing up stairs  Relieving factors: Tylenol   PRECAUTIONS: Fall  RED FLAGS: None   WEIGHT BEARING RESTRICTIONS: No  FALLS:  Has patient fallen in last 6 months? No Near falls.   LIVING ENVIRONMENT: Pt has 3 steps to enter his home w/ rails. (Will specify railing location at next visit)   OCCUPATION: Retired   PLOF: Independent  PATIENT GOALS:  Relieve the pain and get stronger.   NEXT MD VISIT: December, 2024  OBJECTIVE:  Note: Objective measures were completed at Evaluation unless otherwise noted.  DIAGNOSTIC FINDINGS:  N/A  PATIENT SURVEYS:  FOTO 53/63  SCREENING FOR RED FLAGS: Bowel or bladder incontinence: No Spinal tumors: No  COGNITION: Overall cognitive status: Within functional limits for tasks assessed     SENSATION: WFL  MUSCLE LENGTH: Hamstrings: ~90 degrees bilat  POSTURE:  Forward trunk lean with ambulation  PALPATION: TTP at L lumbar sided paraspinals and into L buttock region.   LUMBAR ROM: Deferred to next session.  AROM eval  Flexion   Extension   Right lateral flexion   Left lateral flexion   Right rotation   Left rotation    (Blank rows = not tested)  LOWER EXTREMITY ROM:     Active  Right AROM eval Right PROM eval Left AROM eval Left PROM eval  Hip flexion      Hip extension      Hip abduction      Hip adduction      Hip internal rotation      Hip external rotation      Knee flexion      Knee extension      Ankle dorsiflexion 10 20 20 25   Ankle plantarflexion      Ankle inversion      Ankle eversion       (Blank rows = not tested)  LOWER EXTREMITY MMT:    MMT Right eval Left eval  Hip flexion 4- 4-  Hip extension 4- 4-  Hip abduction 4- 4-  Hip adduction    Hip internal rotation 4- 4-  Hip external rotation 4- 4-  Knee flexion 4 4  Knee extension 4 4  Ankle dorsiflexion 2+ 4  Ankle plantarflexion 4 4   (Blank rows = not tested)  LUMBAR AROM:  Flexion WFL Extension 25%* R/L lateral flexion: 50%*/50%* R/L Rotation: 50%*/ 50%*  LUMBAR SPECIAL TESTS:  FABER test: Negative and FADDIR: negative  FUNCTIONAL TESTS:  5 times sit to stand: 26.12 seconds Functional gait assessment:  16/30 : 767ft  : 15.58 seconds (0.64 m/s) Limited Community Ambulator JOINT MOBILITY:  Joint hypomobility noted w/ CPA's at L2- L5 vertebrae with concordant pain.    GAIT:  Pt ambulates with WBOS, decreased cadence, and forward trunk positioning.   TODAY'S TREATMENT:                                                                                                                              DATE: 06/06/23   Nustep x5 minutes lvl 3.0  administered (0.44m/s)  Mini squats w/ bilat UE support 2 x10 Tandem stance w/o UE support RLE/LLE 2 x30 sec Romberg stance w/o UE support x30 sec with horizontal head turns, CGA to SBA Romberg stance w/o UE support x30 sec w/ vertical head turns, CGA to SBA  [employer] w/ 5# DB's in bilat Ue's 3 x30' down and back. VC's for reciprocal arm swing   Standing marches w/ 2# AW's on bilat LE's 3 x30 sec w/ bilat UE support Leg press bilat LE's 45# 3x8   PATIENT EDUCATION:  Education details: HEP Person educated: Patient Education method: Medical illustrator Education comprehension: verbalized understanding and returned demonstration  HOME EXERCISE PROGRAM: Access Code: [email or web address] URL: [email or web address] Date: 05/30/2023 Prepared by: Ronnie Derby  Exercises - Sit to Stand Without Arm Support  - 1 x daily - 4-5 x weekly - 3 sets - 6 reps - Seated Toe Raise  - 1 x daily - 7 x weekly - 3 sets - 8 reps  ASSESSMENT:  CLINICAL IMPRESSION:  Session focused on administering the and bilat LE strengthening and balance exercises. Pt scored a 0.62 m/s on his , which places him as a limited community ambulator. Pt ambulates with WBOS and decreased arm swing, which may contribute to his decreased gait speed. Pt continues to report improvements in bilat LE strength and balance, as evidenced by improved activity tolerance during exercises in today's session. Pt's HEP was updated from STS to mini-squats, as pt continued to note difficulty completing STS exercises. The adjustment to mini-squats aims to better align with pt's current strength and functional abilities Pt will benefit from  skilled PT services to improve bilat LE strength, reduce LBP, maximize return to independence and reduced falls risk.   OBJECTIVE IMPAIRMENTS: Abnormal gait, decreased activity tolerance, decreased balance, difficulty walking, decreased ROM, decreased strength, hypomobility, and pain.   ACTIVITY LIMITATIONS: carrying, lifting, bending, standing, squatting, stairs, and bed mobility  PARTICIPATION LIMITATIONS: community activity  PERSONAL FACTORS: Age, Past/current experiences, Time since onset of injury/illness/exacerbation, and 1 comorbidity: DM  are also affecting patient's functional outcome.   REHAB POTENTIAL: Good  CLINICAL DECISION MAKING: Stable/uncomplicated  EVALUATION COMPLEXITY: Low   GOALS: Goals reviewed with patient? Yes  SHORT TERM GOALS: Target date: 06/27/23  Pt will be independent with HEP to improve bilat LE strength and decrease LBP with functional activities  Baseline: 05/30/23: HEP given to pt Goal status: INITIAL  LONG  TERM GOALS: Target date: 07/25/23  Pt will improve FOTO to target score to demonstrate clinically significant improvement in functional mobility.  Baseline: 05/30/23: Defer to next session; 06/02/23: 53/63 Goal status: INITIAL  2. Pt will improve 5xSTS to 12.6 seconds or less without UE support to demonstrate improved LE strength to match age based norms.  Baseline: 05/30/23: 26.12 seconds Goal status: INITIAL  3.  Pt will improve FGA by at least 6 points to demonstrate clinically significant improvement in balance with community activities.  Baseline: 05/30/23: 16/30 Goal status: INITIAL  4.  Pt will increase 6 MWT by > 165' to display improvements in functional endurance with community ambulation.  Baseline: 05/30/23: Deferred to next session. 06/02/23: 763ft w/ 5-6/10 NPS  Goal status: INITIAL  5.  Pt will improve R ankle DF AROM by 7 degrees to note clinically significant improvements in AROM and improved safety with ambulation.   Baseline: 05/30/23: 10 degrees  Goal status: INITIAL   PLAN:  PT FREQUENCY: 2x/week  PT DURATION: 8 weeks  PLANNED INTERVENTIONS: 97164- PT Re-evaluation, 97110-Therapeutic exercises, 97530- Therapeutic activity, 97112- Neuromuscular re-education, 97535- Self Care, 09811- Manual therapy, [phone]- Gait training, Patient/Family education, Balance training, Stair training, Joint mobilization, Joint manipulation, Spinal manipulation, Spinal mobilization, Cryotherapy, and Moist heat.  PLAN FOR NEXT SESSION: Continue to emphasize bilat LE strengthening and lumbar mobility exercises  Lovie Macadamia, SPT  Delphia Grates. Fairly IV, PT, DPT Physical Therapist- [place]  [facility]  06/07/2023, 8:23 AM

## 2023-06-07 ENCOUNTER — Ambulatory Visit: Admission: RE | Admit: 2023-06-07 | Payer: PPO | Source: Ambulatory Visit

## 2023-06-07 ENCOUNTER — Telehealth: Payer: Self-pay

## 2023-06-07 NOTE — Telephone Encounter (Signed)
Per Lupita Leash o'Neal with PET scan department, pt was a no show to [facility] and she spoke with son "he said his father now goes to [facility] only for care. He will not be resch."

## 2023-06-08 ENCOUNTER — Ambulatory Visit: Payer: PPO | Admitting: Oncology

## 2023-06-08 ENCOUNTER — Other Ambulatory Visit: Payer: PPO

## 2023-06-15 ENCOUNTER — Inpatient Hospital Stay: Payer: PPO

## 2023-06-15 ENCOUNTER — Ambulatory Visit: Payer: PPO

## 2023-06-15 ENCOUNTER — Inpatient Hospital Stay: Payer: PPO | Attending: Oncology

## 2023-06-15 ENCOUNTER — Inpatient Hospital Stay: Payer: PPO | Admitting: Oncology

## 2023-06-15 DIAGNOSIS — M5459 Other low back pain: Secondary | ICD-10-CM

## 2023-06-15 DIAGNOSIS — R2681 Unsteadiness on feet: Secondary | ICD-10-CM

## 2023-06-15 DIAGNOSIS — M6281 Muscle weakness (generalized): Secondary | ICD-10-CM

## 2023-06-15 NOTE — Therapy (Addendum)
OUTPATIENT PHYSICAL THERAPY THORACOLUMBAR TREATMENT   Patient Name: Nicolas Gonzales MRN: [id number] DOB:March 30, 1951, 72 y.o., male Today's Date: 06/15/2023  END OF SESSION:  PT End of Session - 06/15/23 0812     Visit Number 4    Number of Visits 17    Date for PT Re-Evaluation 07/25/23    PT Start Time 0815    PT Stop Time 0858    PT Time Calculation (min) 43 min    Equipment Utilized During Treatment Gait belt    Activity Tolerance Patient tolerated treatment well    Behavior During Therapy WFL for tasks assessed/performed             Past Medical History:  Diagnosis Date   BPH (benign prostatic hyperplasia)    Diabetes mellitus without complication (HCC)    Elevated PSA    Hypertension    Past Surgical History:  Procedure Laterality Date   NO PAST SURGERIES     PROSTATE BIOPSY N/A 10/11/2022   Procedure: PROSTATE BIOPSY;  Surgeon: Riki Altes, MD;  Location: [facility] OR[facility];  Service: Urology;  Laterality: N/A;   TRANSRECTAL ULTRASOUND N/A 10/11/2022   Procedure: TRANSRECTAL ULTRASOUND;  Surgeon: Riki Altes, MD;  Location: [facility] OR[facility];  Service: Urology;  Laterality: N/A;   Patient Active Problem List   Diagnosis Date Noted   Skin rash 02/16/2023   Leg edema 01/18/2023   HCV (hepatitis C virus) 12/21/2022   Androgen deprivation therapy 12/21/2022   Cellulitis, umbilical 12/09/2022   Weight loss 12/09/2022   Uncontrolled diabetes mellitus with hyperglycemia (HCC) 12/09/2022   Prostate cancer (HCC) 11/18/2022   Goals of care, counseling/discussion 11/18/2022   Hyponatremia 11/18/2022   Transaminitis 11/18/2022   Neoplasm related pain 11/18/2022   Benign prostatic hyperplasia without lower urinary tract symptoms 07/26/2022   Controlled type 2 diabetes mellitus without complication, without long-term current use of insulin (HCC) 07/26/2022   Hypertension, essential 07/26/2022   Upper GI bleed 01/04/2017    PCP: Gracelyn Nurse, MD  REFERRING PROVIDER:  Caro Hight, MD  REFERRING DIAG: M54.50 (ICD-10-CM) - Low back pain, unspecified G95.20 (ICD-10-CM) - Unspecified cord compression  Rationale for Evaluation and Treatment: Rehabilitation  THERAPY DIAG:  Other low back pain  Muscle weakness (generalized)  Unsteadiness on feet  ONSET DATE: Following back surgery to remove mets on spinal cord on 03/22/23   SUBJECTIVE:                                                                                                                                                                                           SUBJECTIVE STATEMENT: Pt reports 2-3/10  NPS in the low back at today's session. He reports new onset mild pain in the posterior R arm/ tricep area.   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 Pt presents with 5/10 NPS LBP at rest and with activity. Pt describes his pain as dull and sometimes this pain radiates from the low back to the lateral aspect of the L thigh. Pt's LBP is aggravated with prolonged walking and standing activities along with climbing stairs. His pain is managed with OTC Tylenol. Pt currently does not use any AD for ambulation, and has reported no falls within the last year however, has frequent instances of near falls. Pt denies N/T in bilat LE's.   PAIN:  Are you having pain? Yes: NPRS scale: 2-3/10 Pain location: Low back pain  Pain description: dull pain, some radiating pain in the L thigh Aggravating factors: prolonged walking and standing, climbing up stairs  Relieving factors: Tylenol   PRECAUTIONS: Fall  RED FLAGS: None   WEIGHT BEARING RESTRICTIONS: No  FALLS:  Has patient fallen in last 6 months? No Near falls.   LIVING ENVIRONMENT: Pt has 3 steps to enter his home w/ rails. (Will specify railing location at next visit)   OCCUPATION: Retired   PLOF: Independent  PATIENT GOALS:  Relieve the pain and get stronger.   NEXT MD VISIT: December, 2024  OBJECTIVE:  Note: Objective measures were completed at Evaluation unless otherwise noted.  DIAGNOSTIC FINDINGS:  N/A  PATIENT SURVEYS:  FOTO 53/63  SCREENING FOR RED FLAGS: Bowel or bladder incontinence: No Spinal tumors: No  COGNITION: Overall cognitive status: Within functional limits for tasks assessed     SENSATION: WFL  MUSCLE LENGTH: Hamstrings: ~90 degrees bilat  POSTURE:  Forward trunk lean with ambulation  PALPATION: TTP at L lumbar sided paraspinals and into L buttock region.   LUMBAR ROM: Deferred to next session.  AROM eval  Flexion   Extension   Right lateral flexion   Left lateral flexion   Right rotation   Left rotation    (Blank rows = not tested)  LOWER EXTREMITY ROM:     Active  Right AROM eval Right PROM eval Left AROM eval Left PROM eval  Hip flexion      Hip extension      Hip abduction      Hip adduction      Hip internal rotation      Hip external rotation      Knee flexion      Knee extension      Ankle dorsiflexion 10 20 20 25   Ankle plantarflexion      Ankle inversion      Ankle eversion       (Blank rows = not tested)  LOWER EXTREMITY MMT:    MMT Right eval Left eval  Hip flexion 4- 4-  Hip extension 4- 4-  Hip abduction 4- 4-  Hip adduction    Hip internal rotation 4- 4-  Hip external rotation 4- 4-  Knee flexion 4 4  Knee extension 4 4  Ankle dorsiflexion 2+ 4  Ankle plantarflexion 4 4   (Blank rows = not tested)  LUMBAR AROM:  Flexion WFL Extension 25%* R/L lateral flexion: 50%*/50%* R/L Rotation: 50%*/ 50%*  LUMBAR SPECIAL TESTS:  FABER test: Negative and FADDIR: negative  FUNCTIONAL TESTS:  5 times sit to stand: 26.12 seconds Functional  gait assessment: 16/30 : 783ft  : 15.58 seconds (0.64 m/s) Limited Community Ambulator JOINT MOBILITY:  Joint hypomobility noted w/ CPA's at L2- L5 vertebrae with concordant pain.    GAIT:  Pt ambulates with WBOS, decreased cadence, and forward trunk positioning.   TODAY'S TREATMENT:                                                                                                                              DATE: 06/15/23   There-ex:  Nustep x5 minutes lvl 3.0  Mini squats w/ 2 KG medicine ball 2 x8 Standing marches w/ 2# AW's on bilat LE's 3 x30 sec w/ bilat UE support RDL's onto 11.5" step w/ 8# DB 2 x10  Leg press w/ bilat LE's 55# x8, 45# x6  Neuro re-ed:  Cone taps (4) different colors w/ RLE/LLE 2 x1 minute/ each side; CGA w/ gait belt donned, w/ no UE support; seated rest break between trials   PATIENT EDUCATION:  Education details: HEP Person educated: Patient Education method: Medical illustrator Education comprehension: verbalized understanding and returned demonstration  HOME EXERCISE PROGRAM: Access Code: [email or web address] URL: [email or web address] Date: 05/30/2023 Prepared by: Ronnie Derby  Exercises - Sit to Stand Without Arm Support  - 1 x daily - 4-5 x weekly - 3 sets - 6 reps - Seated Toe Raise  - 1 x daily - 7 x weekly - 3 sets - 8 reps  ASSESSMENT:  CLINICAL IMPRESSION:  Session focused on bilat LE strengthening and balance exercises. Pt demonstrates continued progress in bilateral LE strength, as evidenced by improved activity tolerance to exercise progressions during today's session. Notable improvements in SLS balance were observed, particularly with RLE/LLE cone taps. However, pt reports increased unsteadiness with LLE > RLE during balance tasks. Pt would benefit from continued skilled PT interventions to further address deficits in LE strength, lumbar pain, and balance, in order to improve functional mobility and reduce fall risk. Pt will benefit from skilled PT services to improve bilat LE strength, reduce LBP, maximize return to independence and reduced falls risk.   OBJECTIVE IMPAIRMENTS: Abnormal gait, decreased  activity tolerance, decreased balance, difficulty walking, decreased ROM, decreased strength, hypomobility, and pain.   ACTIVITY LIMITATIONS: carrying, lifting, bending, standing, squatting, stairs, and bed mobility  PARTICIPATION LIMITATIONS: community activity  PERSONAL FACTORS: Age, Past/current experiences, Time since onset of injury/illness/exacerbation, and 1 comorbidity: DM  are also affecting patient's functional outcome.   REHAB POTENTIAL: Good  CLINICAL DECISION MAKING: Stable/uncomplicated  EVALUATION COMPLEXITY: Low   GOALS: Goals reviewed with patient? Yes  SHORT TERM GOALS: Target date: 06/27/23  Pt will be independent with HEP to improve bilat LE strength and decrease LBP with functional activities  Baseline: 05/30/23: HEP given to pt Goal status: INITIAL  LONG TERM GOALS: Target date: 07/25/23  Pt will improve FOTO to target score to demonstrate clinically significant improvement in functional mobility.  Baseline: 05/30/23: Defer to next session; 06/02/23: 53/63 Goal  status: INITIAL  2. Pt will improve 5xSTS to 12.6 seconds or less without UE support to demonstrate improved LE strength to match age based norms.  Baseline: 05/30/23: 26.12 seconds Goal status: INITIAL  3.  Pt will improve FGA by at least 6 points to demonstrate clinically significant improvement in balance with community activities.  Baseline: 05/30/23: 16/30 Goal status: INITIAL  4.  Pt will increase 6 MWT by > 165' to display improvements in functional endurance with community ambulation.  Baseline: 05/30/23: Deferred to next session. 06/02/23: 714ft w/ 5-6/10 NPS  Goal status: INITIAL  5.  Pt will improve R ankle DF AROM by 7 degrees to note clinically significant improvements in AROM and improved safety with ambulation.  Baseline: 05/30/23: 10 degrees  Goal status: INITIAL   PLAN:  PT FREQUENCY: 2x/week  PT DURATION: 8 weeks  PLANNED INTERVENTIONS: 97164- PT Re-evaluation,  97110-Therapeutic exercises, 97530- Therapeutic activity, 97112- Neuromuscular re-education, 97535- Self Care, 16109- Manual therapy, [phone]- Gait training, Patient/Family education, Balance training, Stair training, Joint mobilization, Joint manipulation, Spinal manipulation, Spinal mobilization, Cryotherapy, and Moist heat.  PLAN FOR NEXT SESSION: Continue to emphasize bilat LE strengthening and hurrycane ambulation.   Lovie Macadamia, SPT  Delphia Grates. Fairly IV, PT, DPT Physical Therapist- [place]  [facility]  06/15/2023, 1:09 PM

## 2023-06-15 NOTE — Assessment & Plan Note (Deleted)
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 [employer], high-volume disease. Not able to tolerate Docetaxel. Patient has persistent transaminitis secondary to HCV Xtandi was discontinued due to worse of ransaminitis.  I recommend to continue ADT as monotherapy for now Repeat [facility] PET in 3 months. Could add ARPI, after his other medical problems are stabilized.

## 2023-06-20 ENCOUNTER — Ambulatory Visit: Payer: PPO

## 2023-06-20 DIAGNOSIS — R2681 Unsteadiness on feet: Secondary | ICD-10-CM

## 2023-06-20 DIAGNOSIS — M5459 Other low back pain: Secondary | ICD-10-CM

## 2023-06-20 DIAGNOSIS — M6281 Muscle weakness (generalized): Secondary | ICD-10-CM

## 2023-06-20 NOTE — Therapy (Signed)
OUTPATIENT PHYSICAL THERAPY THORACOLUMBAR TREATMENT   Patient Name: Nicolas Gonzales MRN: [id number] DOB:[birth year], 72 y.o., male Today's Date: 06/20/2023  END OF SESSION:  PT End of Session - 06/20/23 0957     Visit Number 5    Number of Visits 17    Date for PT Re-Evaluation 07/25/23    PT Start Time 0957    PT Stop Time 1030    PT Time Calculation (min) 33 min    Equipment Utilized During Treatment Gait belt    Activity Tolerance Patient tolerated treatment well    Behavior During Therapy WFL for tasks assessed/performed             Past Medical History:  Diagnosis Date   BPH (benign prostatic hyperplasia)    Diabetes mellitus without complication (HCC)    Elevated PSA    Hypertension    Past Surgical History:  Procedure Laterality Date   NO PAST SURGERIES     PROSTATE BIOPSY N/A 10/11/2022   Procedure: PROSTATE BIOPSY;  Surgeon: Riki Altes, MD;  Location: [facility];  Service: Urology;  Laterality: N/A;   TRANSRECTAL ULTRASOUND N/A 10/11/2022   Procedure: TRANSRECTAL ULTRASOUND;  Surgeon: Riki Altes, MD;  Location: [facility];  Service: Urology;  Laterality: N/A;   Patient Active Problem List   Diagnosis Date Noted   Skin rash 02/16/2023   Leg edema 01/18/2023   HCV (hepatitis C virus) 12/21/2022   Androgen deprivation therapy 12/21/2022   Cellulitis, umbilical 12/09/2022   Weight loss 12/09/2022   Uncontrolled diabetes mellitus with hyperglycemia (HCC) 12/09/2022   Prostate cancer (HCC) 11/18/2022   Goals of care, counseling/discussion 11/18/2022   Hyponatremia 11/18/2022   Transaminitis 11/18/2022   Neoplasm related pain 11/18/2022   Benign prostatic hyperplasia without lower urinary tract symptoms 07/26/2022   Controlled type 2 diabetes mellitus without complication, without long-term current use of insulin (HCC) 07/26/2022   Hypertension, essential 07/26/2022   Upper GI bleed 01/04/2017    PCP: Gracelyn Nurse, MD  REFERRING PROVIDER:  Caro Hight, MD  REFERRING DIAG: M54.50 (ICD-10-CM) - Low back pain, unspecified G95.20 (ICD-10-CM) - Unspecified cord compression  Rationale for Evaluation and Treatment: Rehabilitation  THERAPY DIAG:  Other low back pain  Muscle weakness (generalized)  Unsteadiness on feet  ONSET DATE: Following 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 7-8/10 NPS for his R shoulder. Told oncologist about his R shoulder and arm pain. Denies falls.   He reports new onset mild pain in the posterior R arm/ tricep area.   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 Pt presents with 5/10 NPS LBP at rest and with activity. Pt describes his pain as dull and sometimes this pain radiates from the low back to the lateral aspect of the L thigh. Pt's LBP is aggravated with prolonged walking and standing activities along with climbing stairs. His pain is managed with OTC Tylenol. Pt currently does not use any AD for ambulation, and has reported no falls within the last year however, has frequent instances of near falls. Pt denies N/T in bilat LE's.   PAIN:  Are you having pain? Yes: NPRS scale: 2-3/10 Pain location: Low back pain  Pain description: dull pain, some radiating pain in the L thigh Aggravating factors: prolonged walking and standing, climbing up stairs  Relieving factors: Tylenol   PRECAUTIONS: Fall  RED FLAGS: None   WEIGHT BEARING RESTRICTIONS: No  FALLS:  Has patient fallen in last 6 months? No Near falls.   LIVING ENVIRONMENT: Pt has 3 steps to enter his home w/ rails. (Will specify  railing location at next visit)   OCCUPATION: Retired   PLOF: Independent  PATIENT GOALS: Relieve the pain and get stronger.   NEXT MD VISIT: December, 2024  OBJECTIVE:  Note: Objective measures were completed at Evaluation unless otherwise noted.  DIAGNOSTIC FINDINGS:  N/A  PATIENT SURVEYS:  FOTO 53/63  SCREENING FOR RED FLAGS: Bowel or bladder incontinence: No Spinal tumors: No  COGNITION: Overall cognitive status: Within functional limits for tasks assessed     SENSATION: WFL  MUSCLE LENGTH: Hamstrings: ~90 degrees bilat  POSTURE:  Forward trunk lean with ambulation  PALPATION: TTP at L lumbar sided paraspinals and into L buttock region.   LUMBAR ROM: Deferred to next session.  AROM eval  Flexion   Extension   Right lateral flexion   Left lateral flexion   Right rotation   Left rotation    (Blank rows = not tested)  LOWER EXTREMITY ROM:     Active  Right AROM eval Right PROM eval Left AROM eval Left PROM eval  Hip flexion      Hip extension      Hip abduction      Hip adduction      Hip internal rotation      Hip external rotation      Knee flexion      Knee extension      Ankle dorsiflexion 10 20 20 25   Ankle plantarflexion      Ankle inversion      Ankle eversion       (Blank rows = not tested)  LOWER EXTREMITY MMT:    MMT Right eval Left eval  Hip flexion 4- 4-  Hip extension 4- 4-  Hip abduction 4- 4-  Hip adduction    Hip internal rotation 4- 4-  Hip external rotation 4- 4-  Knee flexion 4 4  Knee extension 4 4  Ankle dorsiflexion 2+ 4  Ankle plantarflexion 4 4   (Blank rows = not tested)  LUMBAR AROM:  Flexion WFL Extension 25%* R/L lateral flexion: 50%*/50%* R/L Rotation: 50%*/ 50%*  LUMBAR SPECIAL TESTS:  FABER test: Negative and FADDIR: negative  FUNCTIONAL TESTS:  5 times sit to stand: 26.12 seconds Functional gait assessment: 16/30 : 758ft  : 15.58 seconds (0.64 m/s) Limited Community  Ambulator JOINT MOBILITY:  Joint hypomobility noted w/ CPA's at L2- L5 vertebrae with concordant pain.   GAIT:  Pt ambulates with WBOS, decreased cadence, and forward trunk positioning.   TODAY'S TREATMENT:                                                                                                                              DATE: 06/20/23   There-ex:  Mini squats w/ 2 KG medicine ball 2x10 Standing marches w/ 2# AW's on bilat LE's  2x30 sec w/ 2 finger support. CGA  Vitals:   BP: 169/102 mm Hg. BP after 2 minutes rest 158/90 mm Hg.  HR: 103 BPM  SPO2: 100 %  Session terminated due to fatigue and hypertension. Educated patient on monitoring BP at home. Pt not very clear on whether or not he is supposed to be on BP meds as he is diagnosed with HTN. Attempts made to call son with patient permission but no answer and unable to leave VM. Educated pt to find out if he is supposed to be on BP meds and if so he should take as prescribed. Educated pt on HTN can increase risk for stroke and educated on FACE acronym and need to seek emergency medicine if experience stroke like symptoms. Pt understanding of education.   PATIENT EDUCATION:  Education details: HEP Person educated: Patient Education method: Medical illustrator Education comprehension: verbalized understanding and returned demonstration  HOME EXERCISE PROGRAM: Access Code: [email or web address] URL: [email or web address] Date: 05/30/2023 Prepared by: Ronnie Derby  Exercises - Sit to Stand Without Arm Support  - 1 x daily - 4-5 x weekly - 3 sets - 6 reps - Seated Toe Raise  - 1 x daily - 7 x weekly - 3 sets - 8 reps  ASSESSMENT:  CLINICAL IMPRESSION:  Session limited due to being late to appointment. Focus of session on strength training. Pt appearing more fatigued than usual today. Vitals assessed and are elevated throughout today's session. Session ended early due to fatigue and elevated BP readings.  Educated pt on readings and increased risk for CVA. Encouraged pt to f/u with son and/or MD to discover if pt is supposed to be on BP meds as pt is not entirely clear to author if he is or not. Pt understanding of education. Pt would benefit from continued skilled PT interventions to further address deficits in LE strength, lumbar pain, and balance, in order to improve functional mobility and reduce fall risk.   OBJECTIVE IMPAIRMENTS: Abnormal gait, decreased activity tolerance, decreased balance, difficulty walking, decreased ROM, decreased strength, hypomobility, and pain.   ACTIVITY LIMITATIONS: carrying, lifting, bending, standing, squatting, stairs, and bed mobility  PARTICIPATION LIMITATIONS: community activity  PERSONAL FACTORS: Age, Past/current experiences, Time since onset of injury/illness/exacerbation, and  1 comorbidity: DM  are also affecting patient's functional outcome.   REHAB POTENTIAL: Good  CLINICAL DECISION MAKING: Stable/uncomplicated  EVALUATION COMPLEXITY: Low   GOALS: Goals reviewed with patient? Yes  SHORT TERM GOALS: Target date: 06/27/23  Pt will be independent with HEP to improve bilat LE strength and decrease LBP with functional activities  Baseline: 05/30/23: HEP given to pt Goal status: INITIAL  LONG TERM GOALS: Target date: 07/25/23  Pt will improve FOTO to target score to demonstrate clinically significant improvement in functional mobility.  Baseline: 05/30/23: Defer to next session; 06/02/23: 53/63 Goal status: INITIAL  2. Pt will improve 5xSTS to 12.6 seconds or less without UE support to demonstrate improved LE strength to match age based norms.  Baseline: 05/30/23: 26.12 seconds Goal status: INITIAL  3.  Pt will improve FGA by at least 6 points to demonstrate clinically significant improvement in balance with community activities.  Baseline: 05/30/23: 16/30 Goal status: INITIAL  4.  Pt will increase 6 MWT by > 165' to display improvements  in functional endurance with community ambulation.  Baseline: 05/30/23: Deferred to next session. 06/02/23: 715ft w/ 5-6/10 NPS  Goal status: INITIAL  5.  Pt will improve R ankle DF AROM by 7 degrees to note clinically significant improvements in AROM and improved safety with ambulation.  Baseline: 05/30/23: 10 degrees  Goal status: INITIAL   PLAN:  PT FREQUENCY: 2x/week  PT DURATION: 8 weeks  PLANNED INTERVENTIONS: 97164- PT Re-evaluation, 97110-Therapeutic exercises, 97530- Therapeutic activity, 97112- Neuromuscular re-education, 97535- Self Care, 23762- Manual therapy, [id number]- Gait training, Patient/Family education, Balance training, Stair training, Joint mobilization, Joint manipulation, Spinal manipulation, Spinal mobilization, Cryotherapy, and Moist heat.  PLAN FOR NEXT SESSION: Continue to emphasize bilat LE strengthening and hurrycane ambulation.    Delphia Grates. Fairly IV, PT, DPT Physical Therapist- [place]  [facility]  06/20/2023, 11:54 AM

## 2023-06-22 ENCOUNTER — Ambulatory Visit: Payer: PPO

## 2023-06-22 DIAGNOSIS — M5459 Other low back pain: Secondary | ICD-10-CM | POA: Diagnosis not present

## 2023-06-22 DIAGNOSIS — M6281 Muscle weakness (generalized): Secondary | ICD-10-CM

## 2023-06-22 DIAGNOSIS — R2681 Unsteadiness on feet: Secondary | ICD-10-CM

## 2023-06-22 NOTE — Therapy (Signed)
OUTPATIENT PHYSICAL THERAPY THORACOLUMBAR TREATMENT   Patient Name: Nicolas Gonzales MRN: [id number] DOB:Jul 05, 1951, 72 y.o., male Today's Date: 06/22/2023  END OF SESSION:  PT End of Session - 06/22/23 0904     Visit Number 6    Number of Visits 17    Date for PT Re-Evaluation 07/25/23    PT Start Time 0903    PT Stop Time 0945    PT Time Calculation (min) 42 min    Equipment Utilized During Treatment Gait belt    Activity Tolerance Patient tolerated treatment well    Behavior During Therapy WFL for tasks assessed/performed             Past Medical History:  Diagnosis Date   BPH (benign prostatic hyperplasia)    Diabetes mellitus without complication (HCC)    Elevated PSA    Hypertension    Past Surgical History:  Procedure Laterality Date   NO PAST SURGERIES     PROSTATE BIOPSY N/A 10/11/2022   Procedure: PROSTATE BIOPSY;  Surgeon: Riki Altes, MD;  Location: [facility] OR[facility];  Service: Urology;  Laterality: N/A;   TRANSRECTAL ULTRASOUND N/A 10/11/2022   Procedure: TRANSRECTAL ULTRASOUND;  Surgeon: Riki Altes, MD;  Location: [facility];  Service: Urology;  Laterality: N/A;   Patient Active Problem List   Diagnosis Date Noted   Skin rash 02/16/2023   Leg edema 01/18/2023   HCV (hepatitis C virus) 12/21/2022   Androgen deprivation therapy 12/21/2022   Cellulitis, umbilical 12/09/2022   Weight loss 12/09/2022   Uncontrolled diabetes mellitus with hyperglycemia (HCC) 12/09/2022   Prostate cancer (HCC) 11/18/2022   Goals of care, counseling/discussion 11/18/2022   Hyponatremia 11/18/2022   Transaminitis 11/18/2022   Neoplasm related pain 11/18/2022   Benign prostatic hyperplasia without lower urinary tract symptoms 07/26/2022   Controlled type 2 diabetes mellitus without complication, without long-term current use of insulin (HCC) 07/26/2022   Hypertension, essential 07/26/2022   Upper GI bleed 01/04/2017    PCP: Gracelyn Nurse, MD  REFERRING PROVIDER:  Caro Hight, MD  REFERRING DIAG: M54.50 (ICD-10-CM) - Low back pain, unspecified G95.20 (ICD-10-CM) - Unspecified cord compression  Rationale for Evaluation and Treatment: Rehabilitation  THERAPY DIAG:  Other low back pain  Muscle weakness (generalized)  Unsteadiness on feet  ONSET DATE: Following 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 States he is on BP medication and took it this morning around 7:00 am and feels better compared to prior session.  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 Pt presents with 5/10 NPS LBP at rest and with activity. Pt describes his pain as dull and sometimes this pain radiates from the low back to the lateral aspect of the L thigh. Pt's LBP is aggravated with prolonged walking and standing activities along with climbing stairs. His pain is managed with OTC Tylenol. Pt currently does not use any AD for ambulation, and has reported no falls within the last year however, has frequent instances of near falls. Pt denies N/T in bilat LE's.   PAIN:  Are you having pain? Yes: NPRS scale: 0/10 Pain location: Low back pain  Pain description: dull pain, some radiating pain in the L thigh Aggravating factors: prolonged walking and standing, climbing up stairs  Relieving factors: Tylenol   PRECAUTIONS: Fall  RED FLAGS: None   WEIGHT BEARING RESTRICTIONS: No  FALLS:  Has patient fallen in last 6 months? No Near falls.   LIVING ENVIRONMENT: Pt has 3 steps to enter his home w/ rails. (Will specify railing location at next visit)   OCCUPATION: Retired    PLOF: Independent  PATIENT GOALS: Relieve the pain and get stronger.   NEXT MD VISIT: December, 2024  OBJECTIVE:  Note: Objective measures were completed at Evaluation unless otherwise noted.  DIAGNOSTIC FINDINGS:  N/A  PATIENT SURVEYS:  FOTO 53/63  SCREENING FOR RED FLAGS: Bowel or bladder incontinence: No Spinal tumors: No  COGNITION: Overall cognitive status: Within functional limits for tasks assessed     SENSATION: WFL  MUSCLE LENGTH: Hamstrings: ~90 degrees bilat  POSTURE:  Forward trunk lean with ambulation  PALPATION: TTP at L lumbar sided paraspinals and into L buttock region.   LUMBAR ROM: Deferred to next session.  AROM eval  Flexion   Extension   Right lateral flexion   Left lateral flexion   Right rotation   Left rotation    (Blank rows = not tested)  LOWER EXTREMITY ROM:     Active  Right AROM eval Right PROM eval Left AROM eval Left PROM eval  Hip flexion      Hip extension      Hip abduction      Hip adduction      Hip internal rotation      Hip external rotation      Knee flexion      Knee extension      Ankle dorsiflexion 10 20 20 25   Ankle plantarflexion      Ankle inversion      Ankle eversion       (Blank rows = not tested)  LOWER EXTREMITY MMT:    MMT Right eval Left eval  Hip flexion 4- 4-  Hip extension 4- 4-  Hip abduction 4- 4-  Hip adduction    Hip internal rotation 4- 4-  Hip external rotation 4- 4-  Knee flexion 4 4  Knee extension 4 4  Ankle dorsiflexion 2+ 4  Ankle plantarflexion 4 4   (Blank rows = not tested)  LUMBAR AROM:  Flexion WFL Extension 25%* R/L lateral flexion: 50%*/50%* R/L Rotation: 50%*/ 50%*  LUMBAR SPECIAL TESTS:  FABER test: Negative and FADDIR: negative  FUNCTIONAL TESTS:  5  times sit to stand: 26.12 seconds Functional gait assessment: 16/30 : 756ft  : 15.58 seconds (0.64 m/s) Limited Community Ambulator JOINT MOBILITY:  Joint hypomobility noted w/ CPA's at  L2- L5 vertebrae with concordant pain.   GAIT:  Pt ambulates with WBOS, decreased cadence, and forward trunk positioning.   TODAY'S TREATMENT:                                                                                                                              DATE: 06/22/23   Vitals via Dynamap LUE:   BP: 158/110 mm Hg  HR: 98 BPM   After 2 minute rest   BP: 170/103 mm Hg  HR: 90 BPM    Tandem stance: 2x30 sec/LE no UE support, SBA    SLS: 2x30 sec/side with 2 finger support. SBA   Vitals via Dynamap LUE:   BP: 165/104 mm Hg  HR: 94 BPM  BP manually after 2 minute rest LUE:   BP: 158/80 mm Hg   Dynamic balance with CGA: 4x10 meters horizontal head turns. Minor slowed gait speed 4x10 meters vertical head turns. Definite slowed gait speed 2x10 meters backwards gait. VC's for step through pattern as pt slows gait cadence due to vision reduction.   BP manually after balance exercise via LUE:   160/90 mm Hg  Educated pt to contact MD provider that prescribed BP medication and inform of elevated readings via systolic and diastolic. Pt aware of PT concerns with elevated PT for general health and CVA risk but also how it can be a barrier for strength training and generally safe completion of PT. Encouraged pt to take BP readings at home via machine but pt reports he can not find his home cuff.  Educated pt to have his son call Thereasa Parkin to update him on education provided to pt.   PATIENT EDUCATION:  Education details: HEP Person educated: Patient Education method: Medical illustrator Education comprehension: verbalized understanding and returned demonstration  HOME EXERCISE PROGRAM: Access Code: [email or web address] URL: [email or web address] Date: 05/30/2023 Prepared by: Ronnie Derby  Exercises - Sit to Stand Without Arm Support  - 1 x daily - 4-5 x weekly - 3 sets - 6 reps - Seated Toe Raise  - 1 x daily - 7 x weekly - 3 sets - 8  reps  ASSESSMENT:  CLINICAL IMPRESSION:  Session continued to be impacted today due to elevated BP readings. Session with focus on light, static and dynamic balance tasks to reduce likelihood of BP elevation. Unsure of accuracy of Dynamap readings as evidenced by manual readings but even with manual BP readings pt has some elevation in systolic and diastolic readings. Educated pt on limitations in PT to maintain pt safety with elevated BP and that he should contact MD that prescribed his BP meds and ask their opinion on it based off of BP readings in PT. Encouraged pt to have his son call Thereasa Parkin if wanting me to  keep son up to date on current education and BP concerns. Pt understanding. Pt would benefit from continued skilled PT interventions to further address deficits in LE strength, lumbar pain, and balance, in order to improve functional mobility and reduce fall risk.     OBJECTIVE IMPAIRMENTS: Abnormal gait, decreased activity tolerance, decreased balance, difficulty walking, decreased ROM, decreased strength, hypomobility, and pain.   ACTIVITY LIMITATIONS: carrying, lifting, bending, standing, squatting, stairs, and bed mobility  PARTICIPATION LIMITATIONS: community activity  PERSONAL FACTORS: Age, Past/current experiences, Time since onset of injury/illness/exacerbation, and 1 comorbidity: DM  are also affecting patient's functional outcome.   REHAB POTENTIAL: Good  CLINICAL DECISION MAKING: Stable/uncomplicated  EVALUATION COMPLEXITY: Low   GOALS: Goals reviewed with patient? Yes  SHORT TERM GOALS: Target date: 06/27/23  Pt will be independent with HEP to improve bilat LE strength and decrease LBP with functional activities  Baseline: 05/30/23: HEP given to pt Goal status: INITIAL  LONG TERM GOALS: Target date: 07/25/23  Pt will improve FOTO to target score to demonstrate clinically significant improvement in functional mobility.  Baseline: 05/30/23: Defer to next session;  06/02/23: 53/63 Goal status: INITIAL  2. Pt will improve 5xSTS to 12.6 seconds or less without UE support to demonstrate improved LE strength to match age based norms.  Baseline: 05/30/23: 26.12 seconds Goal status: INITIAL  3.  Pt will improve FGA by at least 6 points to demonstrate clinically significant improvement in balance with community activities.  Baseline: 05/30/23: 16/30 Goal status: INITIAL  4.  Pt will increase 6 MWT by > 165' to display improvements in functional endurance with community ambulation.  Baseline: 05/30/23: Deferred to next session. 06/02/23: 763ft w/ 5-6/10 NPS  Goal status: INITIAL  5.  Pt will improve R ankle DF AROM by 7 degrees to note clinically significant improvements in AROM and improved safety with ambulation.  Baseline: 05/30/23: 10 degrees  Goal status: INITIAL   PLAN:  PT FREQUENCY: 2x/week  PT DURATION: 8 weeks  PLANNED INTERVENTIONS: 97164- PT Re-evaluation, 97110-Therapeutic exercises, 97530- Therapeutic activity, 97112- Neuromuscular re-education, 97535- Self Care, 73710- Manual therapy, [phone]- Gait training, Patient/Family education, Balance training, Stair training, Joint mobilization, Joint manipulation, Spinal manipulation, Spinal mobilization, Cryotherapy, and Moist heat.  PLAN FOR NEXT SESSION: Check vitals. Continue to emphasize bilat LE strengthening and hurrycane ambulation.    Delphia Grates. Fairly IV, PT, DPT Physical Therapist- [place]  [facility]  06/22/2023, 10:01 AM

## 2023-06-27 ENCOUNTER — Ambulatory Visit: Payer: PPO

## 2023-06-27 DIAGNOSIS — M5459 Other low back pain: Secondary | ICD-10-CM

## 2023-06-27 DIAGNOSIS — R2681 Unsteadiness on feet: Secondary | ICD-10-CM

## 2023-06-27 DIAGNOSIS — M6281 Muscle weakness (generalized): Secondary | ICD-10-CM

## 2023-06-27 NOTE — Therapy (Signed)
OUTPATIENT PHYSICAL THERAPY THORACOLUMBAR TREATMENT   Patient Name: Nicolas Gonzales MRN: [id number] DOB:23-Aug-1950, 72 y.o., male Today's Date: 06/27/2023  END OF SESSION:  PT End of Session - 06/27/23 0803     Visit Number 7    Number of Visits 17    Date for PT Re-Evaluation 07/25/23    PT Start Time 0810    PT Stop Time 0854    PT Time Calculation (min) 44 min    Equipment Utilized During Treatment Gait belt    Activity Tolerance Patient tolerated treatment well    Behavior During Therapy WFL for tasks assessed/performed             Past Medical History:  Diagnosis Date   BPH (benign prostatic hyperplasia)    Diabetes mellitus without complication (HCC)    Elevated PSA    Hypertension    Past Surgical History:  Procedure Laterality Date   NO PAST SURGERIES     PROSTATE BIOPSY N/A 10/11/2022   Procedure: PROSTATE BIOPSY;  Surgeon: Riki Altes, MD;  Location: [facility] ORS;  Service: Urology;  Laterality: N/A;   TRANSRECTAL ULTRASOUND N/A 10/11/2022   Procedure: TRANSRECTAL ULTRASOUND;  Surgeon: Riki Altes, MD;  Location: [facility];  Service: Urology;  Laterality: N/A;   Patient Active Problem List   Diagnosis Date Noted   Skin rash 02/16/2023   Leg edema 01/18/2023   HCV (hepatitis C virus) 12/21/2022   Androgen deprivation therapy 12/21/2022   Cellulitis, umbilical 12/09/2022   Weight loss 12/09/2022   Uncontrolled diabetes mellitus with hyperglycemia (HCC) 12/09/2022   Prostate cancer (HCC) 11/18/2022   Goals of care, counseling/discussion 11/18/2022   Hyponatremia 11/18/2022   Transaminitis 11/18/2022   Neoplasm related pain 11/18/2022   Benign prostatic hyperplasia without lower urinary tract symptoms 07/26/2022   Controlled type 2 diabetes mellitus without complication, without long-term current use of insulin (HCC) 07/26/2022   Hypertension, essential 07/26/2022   Upper GI bleed 01/04/2017    PCP: Gracelyn Nurse, MD  REFERRING PROVIDER:  Caro Hight, MD  REFERRING DIAG: M54.50 (ICD-10-CM) - Low back pain, unspecified G95.20 (ICD-10-CM) - Unspecified cord compression  Rationale for Evaluation and Treatment: Rehabilitation  THERAPY DIAG:  Other low back pain  Muscle weakness (generalized)  Unsteadiness on feet  ONSET DATE: Following 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 Still hsa not contacted MD about elevated BP readings.   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 Pt presents with 5/10 NPS LBP at rest and with activity. Pt describes his pain as dull and sometimes this pain radiates from the low back to the lateral aspect of the L thigh. Pt's LBP is aggravated with prolonged walking and standing activities along with climbing stairs. His pain is managed with OTC Tylenol. Pt currently does not use any AD for ambulation, and has reported no falls within the last year however, has frequent instances of near falls. Pt denies N/T in bilat LE's.   PAIN:  Are you having pain? Yes: NPRS scale: 0/10 Pain location: Low back pain  Pain description: dull pain, some radiating pain in the L thigh Aggravating factors: prolonged walking and standing, climbing up stairs  Relieving factors: Tylenol   PRECAUTIONS: Fall  RED FLAGS: None   WEIGHT BEARING RESTRICTIONS: No  FALLS:  Has patient fallen in last 6 months? No Near falls.   LIVING ENVIRONMENT: Pt has 3 steps to enter his home w/ rails. (Will specify railing location at next visit)   OCCUPATION: Retired   PLOF: Independent  PATIENT GOALS: Relieve the pain and get stronger.   NEXT  MD VISIT: December, 2024  OBJECTIVE:  Note: Objective measures were completed at Evaluation unless otherwise noted.  DIAGNOSTIC FINDINGS:  N/A  PATIENT SURVEYS:  FOTO 53/63  SCREENING FOR RED FLAGS: Bowel or bladder incontinence: No Spinal tumors: No  COGNITION: Overall cognitive status: Within functional limits for tasks assessed     SENSATION: WFL  MUSCLE LENGTH: Hamstrings: ~90 degrees bilat  POSTURE:  Forward trunk lean with ambulation  PALPATION: TTP at L lumbar sided paraspinals and into L buttock region.   LUMBAR ROM: Deferred to next session.  AROM eval  Flexion   Extension   Right lateral flexion   Left lateral flexion   Right rotation   Left rotation    (Blank rows = not tested)  LOWER EXTREMITY ROM:     Active  Right AROM eval Right PROM eval Left AROM eval Left PROM eval  Hip flexion      Hip extension      Hip abduction      Hip adduction      Hip internal rotation      Hip external rotation      Knee flexion      Knee extension      Ankle dorsiflexion 10 20 20 25   Ankle plantarflexion      Ankle inversion      Ankle eversion       (Blank rows = not tested)  LOWER EXTREMITY MMT:    MMT Right eval Left eval  Hip flexion 4- 4-  Hip extension 4- 4-  Hip abduction 4- 4-  Hip adduction    Hip internal rotation 4- 4-  Hip external rotation 4- 4-  Knee flexion 4 4  Knee extension 4 4  Ankle dorsiflexion 2+ 4  Ankle plantarflexion 4 4   (Blank rows = not tested)  LUMBAR AROM:  Flexion WFL Extension 25%* R/L lateral flexion: 50%*/50%* R/L Rotation: 50%*/ 50%*  LUMBAR SPECIAL TESTS:  FABER test: Negative and FADDIR: negative  FUNCTIONAL TESTS:  5 times sit to stand: 26.12 seconds Functional gait assessment: 16/30 : 757ft  :  15.58 seconds (0.64 m/s) [employer] JOINT MOBILITY:  Joint hypomobility noted w/ CPA's at L2- L5 vertebrae with concordant pain.   GAIT:  Pt ambulates with WBOS, decreased  cadence, and forward trunk positioning.   TODAY'S TREATMENT:                                                                                                                              DATE: 06/27/23   There.ex: Vitals via Dynamap LUE prior to mobility:   BP: 157/103 mm Hg  HR: 98 BPM   Gait with 3#AW's for strength and endurance training: 1x300', 1x200' working on foot clearance in swing phase of gait. SBA/CGA. Seated rest b/t bouts. Regualr VC's to improve R foot clearance    Vitals taken manually in LUE:    BP: 160/80 mm Hg  Alternating marches with 3# AW's: 2x30 sec/LE Standing hip abduction with 3# AW's: 2x30 sec/LE    Neuro Re-Ed: 4x10 meters/task with 3# AW's donned, CGA   Horizontal head turns   Vertical head turns   Gait speed changes  Vitals taken manually in LUE:    BP: 158/80 mm Hg  Tandem stance with horizontal head turns: 2x30 sec/LE under BOS no UE support, CGA. Relies on regular 2 finger support to correct LOB with hip righting reactions mainly laterally R/L.    PATIENT EDUCATION:  Education details: HEP Person educated: Patient Education method: Medical illustrator Education comprehension: verbalized understanding and returned demonstration  HOME EXERCISE PROGRAM: Access Code: [email or web address] URL: [email or web address] Date: 05/30/2023 Prepared by: Ronnie Derby  Exercises - Sit to Stand Without Arm Support  - 1 x daily - 4-5 x weekly - 3 sets - 6 reps - Seated Toe Raise  - 1 x daily - 7 x weekly - 3 sets - 8 reps  ASSESSMENT:  CLINICAL IMPRESSION:  Session continued to be impacted today due to elevated BP readings. Continuing with manual readings as unsure of accuracy of dynamap with elevated diastolic. With manual readings, consistent systolic elevation but normal diastolic. Continuing to encourage follow up with MD that prescribed BP meds due to consistent, elevated systolic. Educated pt on benefits of high intensity exercise with  known cancer diagnosis but unable to safely prescribe exercise dosing due to his HTN. Continuing to focus on gentle LE strengthening and dynamic balance. Pt remains with slowed gait cadence and impairments with vestibular input balance exercises with regular stepping strategy, hip righting reactions and regular need for UE support to regain balance placing pt at risk for falls. Pt would benefit from continued skilled PT interventions to further address deficits in LE strength, lumbar pain, and balance, in order to improve functional mobility and reduce fall risk.      OBJECTIVE IMPAIRMENTS: Abnormal gait, decreased activity tolerance, decreased balance, difficulty walking, decreased ROM, decreased strength, hypomobility, and pain.   ACTIVITY LIMITATIONS: carrying, lifting, bending, standing, squatting, stairs, and bed mobility  PARTICIPATION LIMITATIONS: community activity  PERSONAL FACTORS: Age, Past/current experiences, Time since onset of injury/illness/exacerbation, and 1 comorbidity: DM  are also affecting patient's functional outcome.   REHAB POTENTIAL: Good  CLINICAL DECISION MAKING: Stable/uncomplicated  EVALUATION COMPLEXITY: Low   GOALS: Goals reviewed with patient? Yes  SHORT TERM GOALS: Target date: 06/27/23  Pt will be independent with HEP to improve bilat LE strength and decrease LBP with functional activities  Baseline: 05/30/23: HEP given to pt Goal status: INITIAL  LONG TERM GOALS: Target date: 07/25/23  Pt will improve FOTO to target score to demonstrate clinically significant improvement in functional mobility.  Baseline: 05/30/23: Defer to next session; 06/02/23: 53/63 Goal status: INITIAL  2. Pt will improve 5xSTS to 12.6 seconds or less without UE support to demonstrate improved LE strength to match age based norms.  Baseline: 05/30/23: 26.12 seconds Goal status: INITIAL  3.  Pt will improve FGA by at least 6 points to demonstrate clinically significant  improvement in balance with community activities.  Baseline: 05/30/23: 16/30 Goal status: INITIAL  4.  Pt will increase 6 MWT by > 165' to display improvements in functional endurance with community ambulation.  Baseline: 05/30/23: Deferred to next session. 06/02/23: 748ft w/ 5-6/10 NPS  Goal status: INITIAL  5.  Pt will improve R ankle DF AROM by 7 degrees to note clinically significant improvements in AROM and improved safety with ambulation.  Baseline: 05/30/23: 10 degrees  Goal status: INITIAL   PLAN:  PT FREQUENCY: 2x/week  PT DURATION: 8 weeks  PLANNED INTERVENTIONS: 97164- PT Re-evaluation, 97110-Therapeutic exercises, 97530- Therapeutic activity, 97112- Neuromuscular re-education, 97535- Self Care, 02725- Manual therapy, [phone]- Gait training, Patient/Family education, Balance training, Stair training, Joint mobilization, Joint manipulation, Spinal manipulation, Spinal mobilization, Cryotherapy, and Moist heat.  PLAN FOR NEXT SESSION: Check vitals. Continue to emphasize bilat LE strengthening and ambulation with LRAD. Obstacle navigation, SLS.     Delphia Grates. Fairly IV, PT, DPT Physical Therapist- [place]  [facility]  06/27/2023, 9:51 AM

## 2023-07-03 ENCOUNTER — Telehealth: Payer: Self-pay

## 2023-07-03 ENCOUNTER — Ambulatory Visit: Payer: PPO | Attending: Physical Medicine and Rehabilitation

## 2023-07-03 DIAGNOSIS — M5459 Other low back pain: Secondary | ICD-10-CM | POA: Insufficient documentation

## 2023-07-03 DIAGNOSIS — R2681 Unsteadiness on feet: Secondary | ICD-10-CM | POA: Insufficient documentation

## 2023-07-03 DIAGNOSIS — M6281 Muscle weakness (generalized): Secondary | ICD-10-CM | POA: Insufficient documentation

## 2023-07-03 NOTE — Telephone Encounter (Signed)
Called patient about missed PT appointment. Pt's son answered and was unaware of today's appointment. Chartered loss adjuster discussed pt was accidentally on the schedule for 3 times this week and is likely patient and family were aware of other appointments except for today. Pt's son aware of appointments and their times later in the week.

## 2023-07-04 ENCOUNTER — Ambulatory Visit: Payer: PPO

## 2023-07-04 DIAGNOSIS — M6281 Muscle weakness (generalized): Secondary | ICD-10-CM | POA: Diagnosis present

## 2023-07-04 DIAGNOSIS — R2681 Unsteadiness on feet: Secondary | ICD-10-CM | POA: Diagnosis present

## 2023-07-04 DIAGNOSIS — M5459 Other low back pain: Secondary | ICD-10-CM

## 2023-07-04 NOTE — Therapy (Signed)
OUTPATIENT PHYSICAL THERAPY THORACOLUMBAR TREATMENT   Patient Name: Nicolas Gonzales MRN: [id number] DOB:02-Mar-1951, 73 y.o., male Today's Date: 07/04/2023  END OF SESSION:  PT End of Session - 07/04/23 1036     Visit Number 8    Number of Visits 17    Date for PT Re-Evaluation 07/25/23    PT Start Time 1034    PT Stop Time 1115    PT Time Calculation (min) 41 min    Equipment Utilized During Treatment Gait belt    Activity Tolerance Patient tolerated treatment well    Behavior During Therapy WFL for tasks assessed/performed             Past Medical History:  Diagnosis Date   BPH (benign prostatic hyperplasia)    Diabetes mellitus without complication (HCC)    Elevated PSA    Hypertension    Past Surgical History:  Procedure Laterality Date   NO PAST SURGERIES     PROSTATE BIOPSY N/A 10/11/2022   Procedure: PROSTATE BIOPSY;  Surgeon: Riki Altes, MD;  Location: [facility] OR[facility];  Service: Urology;  Laterality: N/A;   TRANSRECTAL ULTRASOUND N/A 10/11/2022   Procedure: TRANSRECTAL ULTRASOUND;  Surgeon: Riki Altes, MD;  Location: [facility] OR[facility];  Service: Urology;  Laterality: N/A;   Patient Active Problem List   Diagnosis Date Noted   Skin rash 02/16/2023   Leg edema 01/18/2023   HCV (hepatitis C virus) 12/21/2022   Androgen deprivation therapy 12/21/2022   Cellulitis, umbilical 12/09/2022   Weight loss 12/09/2022   Uncontrolled diabetes mellitus with hyperglycemia (HCC) 12/09/2022   Prostate cancer (HCC) 11/18/2022   Goals of care, counseling/discussion 11/18/2022   Hyponatremia 11/18/2022   Transaminitis 11/18/2022   Neoplasm related pain 11/18/2022   Benign prostatic hyperplasia without lower urinary tract symptoms 07/26/2022   Controlled type 2 diabetes mellitus without complication, without long-term current use of insulin (HCC) 07/26/2022   Hypertension, essential 07/26/2022   Upper GI bleed 01/04/2017    PCP: Gracelyn Nurse, MD  REFERRING PROVIDER:  Caro Hight, MD  REFERRING DIAG: M54.50 (ICD-10-CM) - Low back pain, unspecified G95.20 (ICD-10-CM) - Unspecified cord compression  Rationale for Evaluation and Treatment: Rehabilitation  THERAPY DIAG:  Other low back pain  Muscle weakness (generalized)  Unsteadiness on feet  ONSET DATE: Following 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 Denies falls. Still fatigued.   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 Pt presents with 5/10 NPS LBP at rest and with activity. Pt describes his pain as dull and sometimes this pain radiates from the low back to the lateral aspect of the L thigh. Pt's LBP is aggravated with prolonged walking and standing activities along with climbing stairs. His pain is managed with OTC Tylenol. Pt currently does not use any AD for ambulation, and has reported no falls within the last year however, has frequent instances of near falls. Pt denies N/T in bilat LE's.   PAIN:  Are you having pain? Yes: NPRS scale: 0/10 Pain location: Low back pain  Pain description: dull pain, some radiating pain in the L thigh Aggravating factors: prolonged walking and standing, climbing up stairs  Relieving factors: Tylenol   PRECAUTIONS: Fall  RED FLAGS: None   WEIGHT BEARING RESTRICTIONS: No  FALLS:  Has patient fallen in last 6 months? No Near falls.   LIVING ENVIRONMENT: Pt has 3 steps to enter his home w/ rails. (Will specify railing location at next visit)   OCCUPATION: Retired   PLOF: Independent  PATIENT GOALS: Relieve the pain and get stronger.   NEXT MD VISIT: December,  2024  OBJECTIVE:  Note: Objective measures were completed at Evaluation unless otherwise noted.  DIAGNOSTIC FINDINGS:  N/A  PATIENT SURVEYS:  FOTO 53/63  SCREENING FOR RED FLAGS: Bowel or bladder incontinence: No Spinal tumors: No  COGNITION: Overall cognitive status: Within functional limits for tasks assessed     SENSATION: WFL  MUSCLE LENGTH: Hamstrings: ~90 degrees bilat  POSTURE:  Forward trunk lean with ambulation  PALPATION: TTP at L lumbar sided paraspinals and into L buttock region.   LUMBAR ROM: Deferred to next session.  AROM eval  Flexion   Extension   Right lateral flexion   Left lateral flexion   Right rotation   Left rotation    (Blank rows = not tested)  LOWER EXTREMITY ROM:     Active  Right AROM eval Right PROM eval Left AROM eval Left PROM eval  Hip flexion      Hip extension      Hip abduction      Hip adduction      Hip internal rotation      Hip external rotation      Knee flexion      Knee extension      Ankle dorsiflexion 10 20 20 25   Ankle plantarflexion      Ankle inversion      Ankle eversion       (Blank rows = not tested)  LOWER EXTREMITY MMT:    MMT Right eval Left eval  Hip flexion 4- 4-  Hip extension 4- 4-  Hip abduction 4- 4-  Hip adduction    Hip internal rotation 4- 4-  Hip external rotation 4- 4-  Knee flexion 4 4  Knee extension 4 4  Ankle dorsiflexion 2+ 4  Ankle plantarflexion 4 4   (Blank rows = not tested)  LUMBAR AROM:  Flexion WFL Extension 25%* R/L lateral flexion: 50%*/50%* R/L Rotation: 50%*/ 50%*  LUMBAR SPECIAL TESTS:  FABER test: Negative and FADDIR: negative  FUNCTIONAL TESTS:  5 times sit to stand: 26.12 seconds Functional gait assessment: 16/30 : 746ft  : 15.58 seconds (0.64 m/s) [employer] JOINT  MOBILITY:  Joint hypomobility noted w/ CPA's at L2- L5 vertebrae with concordant pain.   GAIT:  Pt ambulates with WBOS, decreased cadence, and forward  trunk positioning.   TODAY'S TREATMENT:                                                                                                                              DATE: 07/04/23   Neuro Re-Ed: Vitals taken manually in LUE:   BP: 150/80 mm Hg  HR: 100-105 BPM   Gait with 3#AW's for strength and endurance training: 2x200' working on foot clearance in swing phase of gait. SBA/CGA. Seated rest b/t bouts. Regualr VC's to improve R foot clearance and for improving gait speed with fair carryover.   Side stepping with 3 # AW's: 4x10', CGA   X6 hurdle step overs 2 laps step to pattern. X4 laps step to pattern, CGA.   Airex pad romberg: horizontal and vertical head turns Cga. 2x30 sec/direction. Regular TC's on pelvis needed as pt relies on tactile feedback to prevent posterior hip righting reactions.   Vitals taken manually in LUE:    BP: 160/80 mm Hg  PATIENT EDUCATION:  Education details: HEP Person educated: Patient Education method: Medical illustrator Education comprehension: verbalized understanding and returned demonstration  HOME EXERCISE PROGRAM: Access Code: [email or web address] URL: [email or web address] Date: 05/30/2023 Prepared by: Ronnie Derby  Exercises - Sit to Stand Without Arm Support  - 1 x daily - 4-5 x weekly - 3 sets - 6 reps - Seated Toe Raise  - 1 x daily - 7 x weekly - 3 sets - 8 reps  ASSESSMENT:  CLINICAL IMPRESSION: Continuing PT POC with regular BP checks and dynamic balance exercise. Pt remains visibly fatigued needing regular seated rest breaks and author avoiding hight intensity strength training due to elevated BP readings. Pt making small gains with obstacle clearance with step through pattern but still displays moderate hip righting reactions with dynamic vestibular input movements and unstable surfaces increasing pt's falls risks. Continuing to encourage pt to monitor BP at home. Pt would benefit from continued skilled PT interventions  to further address deficits in LE strength, lumbar pain, and balance, in order to improve functional mobility and reduce fall risk.     OBJECTIVE IMPAIRMENTS: Abnormal gait, decreased activity tolerance, decreased balance, difficulty walking, decreased ROM, decreased strength, hypomobility, and pain.   ACTIVITY LIMITATIONS: carrying, lifting, bending, standing, squatting, stairs, and bed mobility  PARTICIPATION LIMITATIONS: community activity  PERSONAL FACTORS: Age, Past/current experiences, Time since onset of injury/illness/exacerbation, and 1 comorbidity: DM  are also affecting patient's functional outcome.   REHAB POTENTIAL: Good  CLINICAL DECISION MAKING: Stable/uncomplicated  EVALUATION COMPLEXITY: Low   GOALS: Goals reviewed with patient? Yes  SHORT TERM GOALS: Target date: 06/27/23  Pt will be independent with HEP to improve bilat LE strength and decrease LBP with functional activities  Baseline: 05/30/23: HEP given to pt Goal status: INITIAL  LONG TERM GOALS: Target  date: 07/25/23  Pt will improve FOTO to target score to demonstrate clinically significant improvement in functional mobility.  Baseline: 05/30/23: Defer to next session; 06/02/23: 53/63 Goal status: INITIAL  2. Pt will improve 5xSTS to 12.6 seconds or less without UE support to demonstrate improved LE strength to match age based norms.  Baseline: 05/30/23: 26.12 seconds Goal status: INITIAL  3.  Pt will improve FGA by at least 6 points to demonstrate clinically significant improvement in balance with community activities.  Baseline: 05/30/23: 16/30 Goal status: INITIAL  4.  Pt will increase 6 MWT by > 165' to display improvements in functional endurance with community ambulation.  Baseline: 05/30/23: Deferred to next session. 06/02/23: 785ft w/ 5-6/10 NPS  Goal status: INITIAL  5.  Pt will improve R ankle DF AROM by 7 degrees to note clinically significant improvements in AROM and improved safety with  ambulation.  Baseline: 05/30/23: 10 degrees  Goal status: INITIAL   PLAN:  PT FREQUENCY: 2x/week  PT DURATION: 8 weeks  PLANNED INTERVENTIONS: 97164- PT Re-evaluation, 97110-Therapeutic exercises, 97530- Therapeutic activity, 97112- Neuromuscular re-education, 97535- Self Care, 16109- Manual therapy, [phone]- Gait training, Patient/Family education, Balance training, Stair training, Joint mobilization, Joint manipulation, Spinal manipulation, Spinal mobilization, Cryotherapy, and Moist heat.  PLAN FOR NEXT SESSION: Check vitals. Continue to emphasize bilat LE strengthening and ambulation with LRAD. Obstacle navigation, SLS.     Delphia Grates. Fairly IV, PT, DPT Physical Therapist- [place]  [facility]  07/04/2023, 12:13 PM

## 2023-07-06 ENCOUNTER — Ambulatory Visit: Payer: PPO

## 2023-07-06 DIAGNOSIS — M5459 Other low back pain: Secondary | ICD-10-CM

## 2023-07-06 DIAGNOSIS — R2681 Unsteadiness on feet: Secondary | ICD-10-CM

## 2023-07-06 DIAGNOSIS — M6281 Muscle weakness (generalized): Secondary | ICD-10-CM

## 2023-07-06 NOTE — Therapy (Signed)
OUTPATIENT PHYSICAL THERAPY THORACOLUMBAR TREATMENT   Patient Name: Nicolas Gonzales MRN: [id number] DOB:[birth year], 72 y.o., male Today's Date: 07/06/2023  END OF SESSION:  PT End of Session - 07/06/23 1401     Visit Number 9    Number of Visits 17    Date for PT Re-Evaluation 07/25/23    PT Start Time 1400    PT Stop Time 1420    PT Time Calculation (min) 20 min    Equipment Utilized During Treatment Gait belt    Activity Tolerance Patient tolerated treatment well    Behavior During Therapy WFL for tasks assessed/performed             Past Medical History:  Diagnosis Date   BPH (benign prostatic hyperplasia)    Diabetes mellitus without complication (HCC)    Elevated PSA    Hypertension    Past Surgical History:  Procedure Laterality Date   NO PAST SURGERIES     PROSTATE BIOPSY N/A 10/11/2022   Procedure: PROSTATE BIOPSY;  Surgeon: Riki Altes, MD;  Location: [facility];  Service: Urology;  Laterality: N/A;   TRANSRECTAL ULTRASOUND N/A 10/11/2022   Procedure: TRANSRECTAL ULTRASOUND;  Surgeon: Riki Altes, MD;  Location: [facility];  Service: Urology;  Laterality: N/A;   Patient Active Problem List   Diagnosis Date Noted   Skin rash 02/16/2023   Leg edema 01/18/2023   HCV (hepatitis C virus) 12/21/2022   Androgen deprivation therapy 12/21/2022   Cellulitis, umbilical 12/09/2022   Weight loss 12/09/2022   Uncontrolled diabetes mellitus with hyperglycemia (HCC) 12/09/2022   Prostate cancer (HCC) 11/18/2022   Goals of care, counseling/discussion 11/18/2022   Hyponatremia 11/18/2022   Transaminitis 11/18/2022   Neoplasm related pain 11/18/2022   Benign prostatic hyperplasia without lower urinary tract symptoms 07/26/2022   Controlled type 2 diabetes mellitus without complication, without long-term current use of insulin (HCC) 07/26/2022   Hypertension, essential 07/26/2022   Upper GI bleed 01/04/2017    PCP: Gracelyn Nurse, MD  REFERRING PROVIDER:  Caro Hight, MD  REFERRING DIAG: M54.50 (ICD-10-CM) - Low back pain, unspecified G95.20 (ICD-10-CM) - Unspecified cord compression  Rationale for Evaluation and Treatment: Rehabilitation  THERAPY DIAG:  Other low back pain  Muscle weakness (generalized)  Unsteadiness on feet  ONSET DATE: Following back surgery to 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 Still having shoulder pain. Reports ambulating using a shopping in [place]. Having GI distress issues.   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 Pt presents with 5/10 NPS LBP at rest and with activity. Pt describes his pain as dull and sometimes this pain radiates from the low back to the lateral aspect of the L thigh. Pt's LBP is aggravated with prolonged walking and standing activities along with climbing stairs. His pain is managed with OTC Tylenol. Pt currently does not use any AD for ambulation, and has reported no falls within the last year however, has frequent instances of near falls. Pt denies N/T in bilat LE's.   PAIN:  Are you having pain? Yes: NPRS scale: 0/10 Pain location: Low back pain  Pain description: dull pain, some radiating pain in the L thigh Aggravating factors: prolonged walking and standing, climbing up stairs  Relieving factors: Tylenol   PRECAUTIONS: Fall  RED FLAGS: None   WEIGHT BEARING RESTRICTIONS: No  FALLS:  Has patient fallen in last 6 months? No Near falls.   LIVING ENVIRONMENT: Pt has 3 steps to enter his home w/ rails. (Will specify railing location at next visit)   OCCUPATION: Retired   PLOF: Independent  PATIENT GOALS:  Relieve the pain and get stronger.   NEXT MD VISIT: December, 2024  OBJECTIVE:  Note: Objective measures were completed at Evaluation unless otherwise noted.  DIAGNOSTIC FINDINGS:  N/A  PATIENT SURVEYS:  FOTO 53/63  SCREENING FOR RED FLAGS: Bowel or bladder incontinence: No Spinal tumors: No  COGNITION: Overall cognitive status: Within functional limits for tasks assessed     SENSATION: WFL  MUSCLE LENGTH: Hamstrings: ~90 degrees bilat  POSTURE:  Forward trunk lean with ambulation  PALPATION: TTP at L lumbar sided paraspinals and into L buttock region.   LUMBAR ROM: Deferred to next session.  AROM eval  Flexion   Extension   Right lateral flexion   Left lateral flexion   Right rotation   Left rotation    (Blank rows = not tested)  LOWER EXTREMITY ROM:     Active  Right AROM eval Right PROM eval Left AROM eval Left PROM eval  Hip flexion      Hip extension      Hip abduction      Hip adduction      Hip internal rotation      Hip external rotation      Knee flexion      Knee extension      Ankle dorsiflexion 10 20 20 25   Ankle plantarflexion      Ankle inversion      Ankle eversion       (Blank rows = not tested)  LOWER EXTREMITY MMT:    MMT Right eval Left eval  Hip flexion 4- 4-  Hip extension 4- 4-  Hip abduction 4- 4-  Hip adduction    Hip internal rotation 4- 4-  Hip external rotation 4- 4-  Knee flexion 4 4  Knee extension 4 4  Ankle dorsiflexion 2+ 4  Ankle plantarflexion 4 4   (Blank rows = not tested)  LUMBAR AROM:  Flexion WFL Extension 25%* R/L lateral flexion: 50%*/50%* R/L Rotation: 50%*/ 50%*  LUMBAR SPECIAL TESTS:  FABER test: Negative and FADDIR: negative  FUNCTIONAL TESTS:  5 times sit to stand: 26.12 seconds Functional gait assessment: 16/30 :  760ft  : 15.58 seconds (0.64 m/s) Limited Community Ambulator JOINT MOBILITY:  Joint hypomobility noted w/ CPA's at L2- L5 vertebrae with concordant pain.    GAIT:  Pt ambulates with WBOS, decreased cadence, and forward trunk positioning.   TODAY'S TREATMENT:                                                                                                                              DATE: 07/06/23   Neuro Re-Ed: Vitals taken manually in LUE:   BP: 138/78 mm Hg   Gait with 3#AW's for strength and endurance training: 2x200' working on foot clearance in swing phase of gait. SBA/CGA. Seated rest b/t bouts. Improved foot clearance without need for VC's.   Pt requesting to discontinue PT due to feeling unwell.   PATIENT EDUCATION:  Education details: HEP Person educated: Patient Education method: Medical illustrator Education comprehension: verbalized understanding and returned demonstration  HOME EXERCISE PROGRAM: Access Code: [email or web address] URL: [email or web address] Date: 05/30/2023 Prepared by: Ronnie Derby  Exercises - Sit to Stand Without Arm Support  - 1 x daily - 4-5 x weekly - 3 sets - 6 reps - Seated Toe Raise  - 1 x daily - 7 x weekly - 3 sets - 8 reps  ASSESSMENT:  CLINICAL IMPRESSION: Pt arriving late to session then requesting to leave early. Pt reports experiencing GI related issues limiting participation. No billing performed today. Encouraged pt to rest and f/u with PCP if pt feels an indicated need. Encouraged to maintain regular BP checks at home due to last few sessions leading to elevated BP readings. Pt understanding. Pt will require progress note session to assess progress in POC towards PT goals. Pt would benefit from continued skilled PT interventions to further address deficits in LE strength, lumbar pain, and balance, in order to improve functional mobility and reduce fall risk.     OBJECTIVE IMPAIRMENTS: Abnormal gait, decreased activity tolerance, decreased balance, difficulty walking, decreased ROM, decreased strength, hypomobility, and pain.   ACTIVITY LIMITATIONS: carrying, lifting,  bending, standing, squatting, stairs, and bed mobility  PARTICIPATION LIMITATIONS: community activity  PERSONAL FACTORS: Age, Past/current experiences, Time since onset of injury/illness/exacerbation, and 1 comorbidity: DM  are also affecting patient's functional outcome.   REHAB POTENTIAL: Good  CLINICAL DECISION MAKING: Stable/uncomplicated  EVALUATION COMPLEXITY: Low   GOALS: Goals reviewed with patient? Yes  SHORT TERM GOALS: Target date: 06/27/23  Pt will be independent with HEP to improve bilat LE strength and decrease LBP with functional activities  Baseline: 05/30/23: HEP given to pt Goal status: INITIAL  LONG TERM GOALS: Target date: 07/25/23  Pt will improve FOTO to target score to demonstrate clinically significant improvement in functional mobility.  Baseline: 05/30/23: Defer to next session; 06/02/23: 53/63 Goal status: INITIAL  2. Pt will improve 5xSTS to 12.6 seconds or less without UE support to demonstrate improved LE strength to match age based norms.  Baseline: 05/30/23: 26.12 seconds Goal status:  INITIAL  3.  Pt will improve FGA by at least 6 points to demonstrate clinically significant improvement in balance with community activities.  Baseline: 05/30/23: 16/30 Goal status: INITIAL  4.  Pt will increase 6 MWT by > 165' to display improvements in functional endurance with community ambulation.  Baseline: 05/30/23: Deferred to next session. 06/02/23: 746ft w/ 5-6/10 NPS  Goal status: INITIAL  5.  Pt will improve R ankle DF AROM by 7 degrees to note clinically significant improvements in AROM and improved safety with ambulation.  Baseline: 05/30/23: 10 degrees  Goal status: INITIAL   PLAN:  PT FREQUENCY: 2x/week  PT DURATION: 8 weeks  PLANNED INTERVENTIONS: 97164- PT Re-evaluation, 97110-Therapeutic exercises, 97530- Therapeutic activity, 97112- Neuromuscular re-education, 97535- Self Care, 16109- Manual therapy, [phone]- Gait training, Patient/Family  education, Balance training, Stair training, Joint mobilization, Joint manipulation, Spinal manipulation, Spinal mobilization, Cryotherapy, and Moist heat.  PLAN FOR NEXT SESSION: Check vitals. Continue to emphasize bilat LE strengthening and ambulation with LRAD. Obstacle navigation, SLS.     Delphia Grates. Fairly IV, PT, DPT Physical Therapist- [place]  [facility]  07/06/2023, 2:26 PM

## 2023-07-10 ENCOUNTER — Ambulatory Visit: Payer: PPO

## 2023-07-12 ENCOUNTER — Ambulatory Visit: Payer: PPO

## 2023-09-01 ENCOUNTER — Emergency Department: Payer: PPO

## 2023-09-01 ENCOUNTER — Other Ambulatory Visit: Payer: Self-pay

## 2023-09-01 DIAGNOSIS — S0990XA Unspecified injury of head, initial encounter: Secondary | ICD-10-CM | POA: Insufficient documentation

## 2023-09-01 DIAGNOSIS — W1839XA Other fall on same level, initial encounter: Secondary | ICD-10-CM | POA: Diagnosis not present

## 2023-09-01 DIAGNOSIS — R5381 Other malaise: Secondary | ICD-10-CM | POA: Insufficient documentation

## 2023-09-01 DIAGNOSIS — R42 Dizziness and giddiness: Secondary | ICD-10-CM | POA: Insufficient documentation

## 2023-09-01 DIAGNOSIS — Z8546 Personal history of malignant neoplasm of prostate: Secondary | ICD-10-CM | POA: Diagnosis not present

## 2023-09-01 LAB — CBC WITH DIFFERENTIAL/PLATELET
Abs Immature Granulocytes: 0.03 10*3/uL (ref 0.00–0.07)
Basophils Absolute: 0 10*3/uL (ref 0.0–0.1)
Basophils Relative: 1 %
Eosinophils Absolute: 0.1 10*3/uL (ref 0.0–0.5)
Eosinophils Relative: 2 %
HCT: 30.2 % — ABNORMAL LOW (ref 39.0–52.0)
Hemoglobin: 9.7 g/dL — ABNORMAL LOW (ref 13.0–17.0)
Immature Granulocytes: 1 %
Lymphocytes Relative: 9 %
Lymphs Abs: 0.3 10*3/uL — ABNORMAL LOW (ref 0.7–4.0)
MCH: 29.7 pg (ref 26.0–34.0)
MCHC: 32.1 g/dL (ref 30.0–36.0)
MCV: 92.4 fL (ref 80.0–100.0)
Monocytes Absolute: 0.3 10*3/uL (ref 0.1–1.0)
Monocytes Relative: 12 %
Neutro Abs: 2.1 10*3/uL (ref 1.7–7.7)
Neutrophils Relative %: 75 %
Platelets: 216 10*3/uL (ref 150–400)
RBC: 3.27 MIL/uL — ABNORMAL LOW (ref 4.22–5.81)
RDW: 14.5 % (ref 11.5–15.5)
WBC: 2.8 10*3/uL — ABNORMAL LOW (ref 4.0–10.5)
nRBC: 0 % (ref 0.0–0.2)

## 2023-09-01 LAB — BASIC METABOLIC PANEL
Anion gap: 10 (ref 5–15)
BUN: 19 mg/dL (ref 8–23)
CO2: 26 mmol/L (ref 22–32)
Calcium: 8 mg/dL — ABNORMAL LOW (ref 8.9–10.3)
Chloride: 97 mmol/L — ABNORMAL LOW (ref 98–111)
Creatinine, Ser: 0.7 mg/dL (ref 0.61–1.24)
GFR, Estimated: >60 mL/min (ref >60)
Glucose, Bld: 220 mg/dL — ABNORMAL HIGH (ref 70–99)
Potassium: 3.4 mmol/L — ABNORMAL LOW (ref 3.5–5.1)
Sodium: 133 mmol/L — ABNORMAL LOW (ref 135–145)

## 2023-09-01 NOTE — ED Notes (Addendum)
Patient had prolonged QT on EKG-Dr. Fuller Plan made aware and signed off on EKG. Patient denies any chest pain or SOB at this time.

## 2023-09-01 NOTE — ED Triage Notes (Signed)
Patient C/O mechanical fall after missing a step. Patient fell backwards and hit his head. No blood thinners. Patient endorses dizziness, denies nausea or headache.

## 2023-09-01 NOTE — ED Provider Triage Note (Signed)
Emergency Medicine Provider Triage Evaluation Note  Nicolas Gonzales , a 73 y.o. male  was evaluated in triage.  Pt complains of pain in head after mechanical, nonsyncopal fall earlier today. He went to step up on a curb and fell backward no loss of consciousness.  Patient denies feeling dizzy prior to fall.  Physical Exam  Ht 5\' 8"  (1.727 m)   Wt 70.3 kg   BMI 23.57 kg/m  Gen:   Awake, no distress   Resp:  Normal effort  MSK:   Moves extremities without difficulty  Other:  Hematoma on occiput  Medical Decision Making  Medically screening exam initiated at 9:48 PM.  Appropriate orders placed.  Nicolas Gonzales was informed that the remainder of the evaluation will be completed by another provider, this initial triage assessment does not replace that evaluation, and the importance of remaining in the ED until their evaluation is complete.  Imaging ordered.   Nicolas Pester, FNP 09/01/23 2302

## 2023-09-02 ENCOUNTER — Emergency Department
Admission: EM | Admit: 2023-09-02 | Discharge: 2023-09-02 | Disposition: A | Discharge status: Home / Self Care | Payer: PPO | Attending: Emergency Medicine | Admitting: Emergency Medicine

## 2023-09-02 DIAGNOSIS — S0990XA Unspecified injury of head, initial encounter: Secondary | ICD-10-CM

## 2023-09-02 DIAGNOSIS — R5381 Other malaise: Secondary | ICD-10-CM

## 2023-09-02 DIAGNOSIS — R42 Dizziness and giddiness: Secondary | ICD-10-CM

## 2023-09-02 DIAGNOSIS — W19XXXA Unspecified fall, initial encounter: Secondary | ICD-10-CM

## 2023-09-02 MED ORDER — BACITRACIN ZINC 500 UNIT/GM EX OINT
TOPICAL_OINTMENT | CUTANEOUS | Status: AC
  Administered 2023-09-02: 1 via TOPICAL
  Filled 2023-09-02: qty 1.8

## 2023-09-02 NOTE — ED Notes (Signed)
Bacitracin applied to abrasion on posterior aspect of head and dressed with tegaderm.

## 2023-09-02 NOTE — Discharge Instructions (Signed)
As we discussed, your evaluation after your fall was generally reassuring.  You may have some symptoms of a mild concussion given the ongoing dizziness, but this may also be related to your cancer and cancer treatment.  It is important you get rest over the weekend and to try to eat and drink plenty of food and liquids.  Contact your regular doctor at the next available opportunity.  Return to the Emergency Department if you develop new or worsening symptoms that concern you.

## 2023-09-02 NOTE — ED Provider Notes (Signed)
[facility] Provider Note    Event Date/Time   First MD Initiated Contact with Patient 09/02/23 0330     (approximate)   History   Fall   HPI Nicolas Gonzales is a 73 y.o. male whose medical history is notable for metastatic prostate cancer currently undergoing radiation therapy at [facility].  He presents for evaluation after a mechanical fall.  He states that he missed a step and fell backwards striking the back of his head where there is an abrasion.  He said that he has felt dizziness since that time and feels unsteady on his feet although he does not believe that he lost consciousness at the fall and did not feel dizzy beforehand.  He states that he feels weak in general and has been feeling that way for some time.  He has no pain in his extremities and denies chest pain and shortness of breath as well as fever, chills, nausea, and vomiting.     Physical Exam   Triage Vital Signs: ED Triage Vitals  Encounter Vitals Group     BP 09/01/23 2148 116/78     Systolic BP Percentile --      Diastolic BP Percentile --      Pulse Rate 09/01/23 2148 89     Resp 09/01/23 2148 18     Temp 09/01/23 2148 98 F (36.7 C)     Temp Source 09/01/23 2148 Oral     SpO2 09/01/23 2148 99 %     Weight 09/01/23 2142 70.3 kg (155 lb)     Height 09/01/23 2142 1.727 m (5\' 8" )     Head Circumference --      Peak Flow --      Pain Score 09/02/23 0444 9     Pain Loc --      Pain Education --      Exclude from Growth Chart --     Most recent vital signs: Vitals:   09/01/23 2148 09/02/23 0444  BP: 116/78 125/85  Pulse: 89 94  Resp: 18 17  Temp: 98 F (36.7 C) 98.3 F (36.8 C)  SpO2: 99% 100%    General: Appears chronically ill but in no acute distress. CV:  Good peripheral perfusion.  Regular rate and rhythm. Resp:  Normal effort. Speaking easily and comfortably, no accessory muscle usage nor intercostal retractions.   Abd:  No distention.  Other:  Patient has  a large circular abrasion, superficial, on the occiput from his fall.  Minimally tender to palpation.  No tenderness to palpation of his cervical spine and no pain or tenderness with flexion, extension, and rotation of his head neck from side-to-side.  He has no injuries to his extremities.  He appears cachectic and generally deconditioned but not acutely ill.   ED Results / Procedures / Treatments   Labs (all labs ordered are listed, but only abnormal results are displayed) Labs Reviewed  CBC WITH DIFFERENTIAL/PLATELET - Abnormal; Notable for the following components:      Result Value   WBC 2.8 (*)    RBC 3.27 (*)    Hemoglobin 9.7 (*)    HCT 30.2 (*)    Lymphs Abs 0.3 (*)    All other components within normal limits  BASIC METABOLIC PANEL - Abnormal; Notable for the following components:   Sodium 133 (*)    Potassium 3.4 (*)    Chloride 97 (*)    Glucose, Bld 220 (*)    Calcium  8.0 (*)    All other components within normal limits     EKG  ED ECG REPORT I, Loleta Rose, the attending physician, personally viewed and interpreted this ECG.  Date: 09/01/2023 EKG Time: 21:53 Rate: 128 Rhythm: sinus tachycardia QRS Axis: normal Intervals: normal ST/T Wave abnormalities: Non-specific ST segment / T-wave changes, but no clear evidence of acute ischemia. Narrative Interpretation: no definitive evidence of acute ischemia; does not meet STEMI criteria.   RADIOLOGY I viewed and interpreted the patient's CT head and CT cervical spine and see no evidence of acute injury including intracranial hemorrhage and cervical spine fracture   PROCEDURES:  Critical Care performed: No  Procedures    IMPRESSION / MDM / ASSESSMENT AND PLAN / ED COURSE  I reviewed the triage vital signs and the nursing notes.                              Differential diagnosis includes, but is not limited to, acute intracranial hemorrhage, cervical spine fracture, cardiogenic syncope, dehydration,  electrolyte or metabolic abnormality, less likely CVA.  Patient's presentation is most consistent with acute presentation with potential threat to life or bodily function.  Labs/studies ordered: CT head, CT cervical spine, CBC with differential, BMP  Interventions/Medications given:  Medications  bacitracin ointment (1 Application Topical Given 09/02/23 0549)    (Note:  hospital course my include additional interventions and/or labs/studies not listed above.)   Patient has no evidence of acute injury other than a superficial abrasion to the back of his head.  Imaging is reassuring and labs are generally reassuring other than mild anemia and leukopenia and minimal electrolyte abnormalities.  He is having some symptoms such as dizziness that could be attributable to concussion.  However he is also substantially deconditioned and chronically ill with metastatic cancer.  The patient, his son, and I will had an extended conversation about goals of care and outpatient management versus inpatient admission.  I explained that I did not feel that admission was warranted nor would be beneficial for him and the son agrees and will help his father at home.  The patient will rest over the weekend and try to focus on getting his strength back through rehydration and nutrition, and they will follow-up with his regular doctor at [facility] on Monday.  I gave my usual and customary return precautions and the patient and son both agree with the plan.     FINAL CLINICAL IMPRESSION(S) / ED DIAGNOSES   Final diagnoses:  Fall, initial encounter  Minor head injury, initial encounter  Dizziness  Physical deconditioning     Rx / DC Orders   ED Discharge Orders     None        Note:  This document was prepared using Dragon voice recognition software and may include unintentional dictation errors.   Loleta Rose, MD 09/02/23 [phone]

## 2023-12-31 DEATH — deceased
